# Patient Record
Sex: Male | Born: 1945 | Race: Black or African American | Hispanic: No | Marital: Married | Smoking: Current some day smoker
Health system: Southern US, Community
[De-identification: ages and names within clinical notes are randomized; demographics above are authoritative.]

## PROBLEM LIST (undated history)

## (undated) DIAGNOSIS — I10 Essential (primary) hypertension: Secondary | ICD-10-CM

## (undated) DIAGNOSIS — E782 Mixed hyperlipidemia: Secondary | ICD-10-CM

## (undated) DIAGNOSIS — E1169 Type 2 diabetes mellitus with other specified complication: Secondary | ICD-10-CM

## (undated) DIAGNOSIS — Z923 Personal history of irradiation: Secondary | ICD-10-CM

## (undated) DIAGNOSIS — C320 Malignant neoplasm of glottis: Secondary | ICD-10-CM

## (undated) HISTORY — DX: Type 2 diabetes mellitus with other specified complication: E11.69

## (undated) HISTORY — DX: Mixed hyperlipidemia: E78.2

## (undated) HISTORY — PX: OTHER SURGICAL HISTORY: SHX169

---

## 2014-01-04 DIAGNOSIS — C320 Malignant neoplasm of glottis: Secondary | ICD-10-CM

## 2014-01-04 HISTORY — DX: Malignant neoplasm of glottis: C32.0

## 2014-01-22 ENCOUNTER — Encounter: Payer: Self-pay | Admitting: Radiation Oncology

## 2014-01-22 NOTE — Progress Notes (Signed)
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  Denies any pain,difficulty swallowing, nor referred otalgia. He was assessed by Dr. Melissa Montane who performed a fiberoptic exam which revealed "an irregular area on the left vocal cord that was suspicious for Squamous Cell Carcinoma."   Biopsies of the Vocal Cord (if applicable) revealed:  09/30/00 Vocal cord Biopsy: 1. Anterior Commissure:      Invasive Squamous Cell Carcinoma 2. Vocal Cord Biopsy: Left:     Invasive Squamous Cell Carcinoma   Nutrition Status:  Weight changes: NO  Swallowing status: Sensation of something sticking in his throat upon presentation, but now denies pain or difficutly swallowing  Plans, if any, for PEG tube:   Tobacco/Marijuana/Snuff/ETOH use: Smoking cigars ~ 5 daily for10 years, No drinking, No Illicit Drug use  Past/Anticipated interventions by otolaryngology, if any: Dr. Melissa Montane: Vocal Cord Biopsy  Past/Anticipated interventions by medical oncology, if any: Unknown  Referrals yet, to any of the following?  Social Work?  Dentistry?  Swallowing therapy?   Nutrition? [name] 01/23/14  Med/Onc?   PEG placement?   SAFETY ISSUES:  Prior radiation?NO  Pacemaker/ICD?NO  Possible current pregnancy? N/A  Is the patient on methotrexate? NO  Current Complaints / other details:

## 2014-01-23 ENCOUNTER — Ambulatory Visit
Admission: RE | Admit: 2014-01-23 | Discharge: 2014-01-23 | Disposition: A | Discharge status: Home / Self Care | Payer: Commercial Managed Care - PPO | Source: Ambulatory Visit | Attending: Radiation Oncology | Admitting: Radiation Oncology

## 2014-01-23 ENCOUNTER — Encounter: Payer: Self-pay | Admitting: Radiation Oncology

## 2014-01-23 ENCOUNTER — Encounter: Payer: Self-pay | Admitting: *Deleted

## 2014-01-23 ENCOUNTER — Ambulatory Visit: Payer: Medicare Other | Admitting: Nutrition

## 2014-01-23 VITALS — BP 142/75 | HR 81 | Temp 99.0°F | Ht 68.0 in | Wt 236.3 lb

## 2014-01-23 DIAGNOSIS — Z510 Encounter for antineoplastic radiation therapy: Secondary | ICD-10-CM | POA: Insufficient documentation

## 2014-01-23 DIAGNOSIS — C320 Malignant neoplasm of glottis: Secondary | ICD-10-CM | POA: Diagnosis not present

## 2014-01-23 DIAGNOSIS — R5381 Other malaise: Secondary | ICD-10-CM

## 2014-01-23 DIAGNOSIS — F17200 Nicotine dependence, unspecified, uncomplicated: Secondary | ICD-10-CM | POA: Diagnosis not present

## 2014-01-23 DIAGNOSIS — R5383 Other fatigue: Secondary | ICD-10-CM

## 2014-01-23 HISTORY — DX: Malignant neoplasm of glottis: C32.0

## 2014-01-23 MED ORDER — LARYNGOSCOPY SOLUTION RAD-ONC
15.0000 mL | Freq: Once | TOPICAL | Status: AC
  Administered 2014-01-23: 15 mL via TOPICAL

## 2014-01-23 NOTE — Progress Notes (Signed)
Radiation Oncology         (336) [phone] ________________________________  Initial inpatient Consultation  Name: Shaun Henry MRN: [id number]  Date: 01/23/2014  DOB: 05/09/1946  CC:No primary provider on file.  Melissa Montane, MD   REFERRING PHYSICIAN: Melissa Montane, MD  DIAGNOSIS: Glottic squamous cell carcinoma, stage I T1N0M0 (to be clarified by discussion with ENT and imaging)  HISTORY OF PRESENT ILLNESS::Shaun Henry is a 68 y.o. male who presented 3 months ago with hoarseness.  Denies any pain,difficulty swallowing, nor referred otalgia. He was assessed by Dr. Melissa Montane who performed a fiberoptic exam which revealed an irregular area on the left vocal cord that was suspicious for Squamous Cell Carcinoma.  Laryngoscopic exam under anesthesia and biopsies were then done on 6-26.  The exophytic mass involved the anterior  Left vocal cord, and extended into the anterior commissure.  The left ventricle above appeared full. No comment about impaired mobility of the cord.  Right cord appeared normal   Biopsies of the Vocal Cord (if applicable) revealed:  3/47/42  Vocal cord Biopsy:  1. Anterior Commissure:  Invasive Squamous Cell Carcinoma  2. Vocal Cord Biopsy: Left:  Invasive Squamous Cell Carcinoma  He denies any current swallowing difficulty or any pain.  No weight loss. He smokes 5 or less cigars daily, and has done so for 10 years. Denies any other tobacco history or ETOH abuse.  PREVIOUS RADIATION THERAPY: No  PAST MEDICAL HISTORY:  has a past medical history of Squamous cell carcinoma of vocal cord (01/04/14).    PAST SURGICAL HISTORY:No past surgical history on file.  FAMILY HISTORY: family history is not on file.  SOCIAL HISTORY:  reports that he has been smoking Cigars.  He does not have any smokeless tobacco history on file. He reports that he does not drink alcohol or use illicit drugs.  ALLERGIES: No known allergies  MEDICATIONS:  Current Outpatient Prescriptions    Medication Sig Dispense Refill  . simvastatin (ZOCOR) 40 MG tablet Take by mouth.      . triamterene-hydrochlorothiazide (DYAZIDE) 37.5-25 MG per capsule Take by mouth.      . nicotine (NICODERM CQ) 14 mg/24hr patch Apply 14 mg patch daily x6wk, then 7 mg patch daily x2wk  14 patch  2  . nicotine (NICODERM CQ) 7 mg/24hr patch Apply 14 mg patch daily x6wk, then 7 mg patch daily x2wk  14 patch  0   No current facility-administered medications for this encounter.    REVIEW OF SYSTEMS:  Notable for that above.   PHYSICAL EXAM:  height is 5\' 8"  (1.727 m) and weight is 236 lb 4.8 oz (107.185 kg). His temperature is 99 F (37.2 C). His blood pressure is 142/75 and his pulse is 81.   General: Alert and oriented, in no acute distress. hoarse HEENT: Head is normocephalic. Pupils are equally round and reactive to light. Extraocular movements are intact. Oropharynx is clear. Poor dentition Neck: There is a 1.5 - 2 cm palpable soft mass in level 3 of the right neck anteriorly. No other signs of cervical or supraclavicular lymphadenopathy. Heart: Regular in rate and rhythm with no murmurs, rubs, or gallops. Chest: Clear to auscultation bilaterally, with no rhonchi, wheezes, or rales. Abdomen: Soft, nontender, nondistended, with no rigidity or guarding. Extremities: No cyanosis or edema in UE's Skin: No concerning lesions. Musculoskeletal: symmetric strength and muscle tone throughout. Neurologic: Cranial nerves II through XII are grossly intact. No obvious focalities. Speech is fluent. Coordination is intact. Psychiatric: Judgment  and insight are intact. Affect is appropriate.  After anesthetizing the nasal cavity with topical lidocaine and oxymetazoline, the flexible endoscope was introduced and passed through the nasal cavity. Unfortunately, the light source for the scop was dimmed and the quality of the images were too dark to ascertain much information regarding his laryngeal/pharyngeal anatomy. I  will NOT charge for this procedure.  ECOG = 0  0 - Asymptomatic (Fully active, able to carry on all predisease activities without restriction)  1 - Symptomatic but completely ambulatory (Restricted in physically strenuous activity but ambulatory and able to carry out work of a light or sedentary nature. For example, light housework, office work)  2 - Symptomatic, <50% in bed during the day (Ambulatory and capable of all self care but unable to carry out any work activities. Up and about more than 50% of waking hours)  3 - Symptomatic, >50% in bed, but not bedbound (Capable of only limited self-care, confined to bed or chair 50% or more of waking hours)  4 - Bedbound (Completely disabled. Cannot carry on any self-care. Totally confined to bed or chair)  5 - Death   Eustace Pen MM, Creech RH, Tormey DC, et al. [phone]). "Toxicity and response criteria of the [facility]". [place] Oncol. 5 (6): 649-55   LABORATORY DATA:  No results found for this basename: WBC,  HGB,  HCT,  MCV,  PLT   CMP  No results found for this basename: na,  k,  cl,  co2,  glucose,  bun,  creatinine,  calcium,  prot,  albumin,  ast,  alt,  alkphos,  bilitot,  gfrnonaa,  gfraa      PATHOLOGY: 01/04/14  Vocal cord Biopsy:  1. Anterior Commissure:  Invasive Squamous Cell Carcinoma  2. Vocal Cord Biopsy: Left:  Invasive Squamous Cell Carcinoma    RADIOGRAPHY: No results found.    IMPRESSION/PLAN: This patient has what appears to be clinical T1 vs T2 glottic squamous cell carcinoma. I need to speak with Dr. Janace Hoard to clarify this as our laryngoscope was out of order today.  I also want to rule out neck nodes with CT imaging due to a soft mass in the right cervical neck today.  I suspect this will be negative.  Will also obtain a chest CT at that time for staging and ruling out secondary lung cancer.  For what is likely going to be an early stage glottic cancer, I recommend definitive external  beam radiotherapy to a dose of 63 to 65.25 Gray in 28 to 29 fractions at 2.25 gray per fraction. I would treat with opposed lateral beams focussed on the larynx. I will make sure that the dose the anterior commissure it is adequate.  We discussed the risks benefits and side effects of laryngeal radiotherapy which include but are not necessarily limited to fatigue, skin irritation and esophagitis in the acute setting. He understands that late effects may include rare damage to the internal tissues including the cartilage, larynx, and esophagus. He is enthusiastic about proceeding. A consent form has been signed and placed in his chart. We will simulate him next week following the CT staging scans, anticipating starting treatment a week thereafter.  The patient continues to use tobacco. The patient was counseled to stop using tobacco and was offered pharmacotherapy and further counseling to help with this. The patient is interested in pharmacotherapy  - nicotine patches. Rx made today.   He will see Social Work and our Nutritionist Today.  Will screen with a TSH next week to rule out baseline hypothyroidism. He lives in [place] and I offered to refer him to [facility] for treatments but he prefers to drive 30 min to [place] for treatments here. __________________________________________   Eppie Gibson, MD

## 2014-01-23 NOTE — Progress Notes (Signed)
68 year old male diagnosed with throat soreness and hoarseness/Glottic cancer.  He is a patient of Dr. Isidore Moos.  Past medical history includes tobacco.  Medications include Zocor.  Labs were reviewed.  Height:68 inches. Weight: 236.3 pounds. UBW: 240 pounds. BMI: 35.94  Patient and wife here for first appointment with MD.  Patient denies current nutrition side effects.  States difficulty swallowing has resolved and now "it has just moved down."  No weight changes.  Nutrition Diagnosis:  Predicted suboptimal energy intake related to glottic cancer and associated treatments as evidenced by this condition for which research shows inadequate oral intake.  Intervention:  Educated patient on strategies for eating healthy diet with adequate calories and protein to promote maintenance of lean body mass.  Discussed strategies for eating with difficulty swallowing.  Encouraged small frequent meals and snacks.  Provided fact sheets and contact information.  Teach back method used.  Monitoring, Evaluation, Goals:  Patient will tolerate healthy diet with adequate calories and protein to promote maintenance of lean body mass.  Next Visit:  To be scheduled as needed.

## 2014-01-24 ENCOUNTER — Telehealth: Payer: Self-pay | Admitting: *Deleted

## 2014-01-24 ENCOUNTER — Encounter: Payer: Self-pay | Admitting: Radiation Oncology

## 2014-01-24 MED ORDER — NICOTINE 14 MG/24HR TD PT24: MEDICATED_PATCH | TRANSDERMAL | Status: DC

## 2014-01-24 MED ORDER — NICOTINE 7 MG/24HR TD PT24
MEDICATED_PATCH | TRANSDERMAL | Status: DC
Start: 2014-01-24 — End: 2014-04-19

## 2014-01-24 NOTE — Telephone Encounter (Signed)
CALLED PATIENT TO INFORM OF TESTS, LVM FOR A RETURN CALL 

## 2014-01-25 ENCOUNTER — Telehealth: Payer: Self-pay | Admitting: *Deleted

## 2014-01-25 NOTE — Telephone Encounter (Signed)
CALLED PATIENT TO INFORM OF LAB, TEST, FNC VISIT WITH DR. Isidore Moos AND SIM, SPOKE WITH PATIENT'S WIFE EVA AND SHE IS AWARE OF THESE APPTS.

## 2014-01-26 NOTE — Progress Notes (Signed)
Met with patient and his wife prior to initial consult with Dr. Isidore Moos.  Introduced myself as Designer, television/film set, explained my role as a member of the Care Team, provided contact information, encouraged them to contact me with questions/concerns as treatments/procedures begin.  They indicated understanding.   Briefly explained RT tmt, including showing/explanation of mask.  Initiating navigation as L1 patient (new) with this encounter.  Gayleen Orem, RN, BSN, [facility] Head & Neck Oncology Navigator [phone]

## 2014-01-31 ENCOUNTER — Telehealth: Payer: Self-pay | Admitting: *Deleted

## 2014-01-31 NOTE — Telephone Encounter (Signed)
Called patient at home and mobile #s to see if he had any questions prior to his arrival tomorrow for CT SIM.  LVMM.  Gayleen Orem, RN, BSN, [facility] Head & Neck Oncology Navigator [phone]

## 2014-02-01 ENCOUNTER — Encounter: Payer: Self-pay | Admitting: *Deleted

## 2014-02-01 ENCOUNTER — Encounter: Payer: Self-pay | Admitting: Radiation Oncology

## 2014-02-01 ENCOUNTER — Ambulatory Visit
Admission: RE | Admit: 2014-02-01 | Discharge: 2014-02-01 | Disposition: A | Discharge status: Home / Self Care | Payer: Commercial Managed Care - PPO | Source: Ambulatory Visit | Attending: Radiation Oncology | Admitting: Radiation Oncology

## 2014-02-01 ENCOUNTER — Ambulatory Visit: Payer: Commercial Managed Care - PPO

## 2014-02-01 ENCOUNTER — Ambulatory Visit (HOSPITAL_COMMUNITY)
Admission: RE | Admit: 2014-02-01 | Discharge: 2014-02-01 | Disposition: A | Discharge status: Home / Self Care | Payer: Commercial Managed Care - PPO | Source: Ambulatory Visit | Attending: Radiation Oncology | Admitting: Radiation Oncology

## 2014-02-01 ENCOUNTER — Ambulatory Visit: Admission: RE | Admit: 2014-02-01 | Payer: Self-pay | Source: Ambulatory Visit | Admitting: Radiation Oncology

## 2014-02-01 ENCOUNTER — Encounter (HOSPITAL_COMMUNITY): Payer: Self-pay

## 2014-02-01 VITALS — BP 132/86 | HR 82 | Temp 98.1°F | Resp 12 | Wt 236.1 lb

## 2014-02-01 DIAGNOSIS — M47812 Spondylosis without myelopathy or radiculopathy, cervical region: Secondary | ICD-10-CM | POA: Insufficient documentation

## 2014-02-01 DIAGNOSIS — E049 Nontoxic goiter, unspecified: Secondary | ICD-10-CM | POA: Insufficient documentation

## 2014-02-01 DIAGNOSIS — C320 Malignant neoplasm of glottis: Secondary | ICD-10-CM | POA: Insufficient documentation

## 2014-02-01 DIAGNOSIS — R5381 Other malaise: Secondary | ICD-10-CM

## 2014-02-01 DIAGNOSIS — Z79899 Other long term (current) drug therapy: Secondary | ICD-10-CM | POA: Insufficient documentation

## 2014-02-01 DIAGNOSIS — K119 Disease of salivary gland, unspecified: Secondary | ICD-10-CM | POA: Insufficient documentation

## 2014-02-01 DIAGNOSIS — R5383 Other fatigue: Secondary | ICD-10-CM

## 2014-02-01 DIAGNOSIS — Z510 Encounter for antineoplastic radiation therapy: Secondary | ICD-10-CM | POA: Diagnosis not present

## 2014-02-01 HISTORY — DX: Essential (primary) hypertension: I10

## 2014-02-01 LAB — CBC WITH DIFFERENTIAL/PLATELET
BASO%: 0.8 % (ref 0.0–2.0)
Basophils Absolute: 0 10*3/uL (ref 0.0–0.1)
EOS%: 1.9 % (ref 0.0–7.0)
Eosinophils Absolute: 0.1 10*3/uL (ref 0.0–0.5)
HCT: 43.8 % (ref 38.4–49.9)
HGB: 14.2 g/dL (ref 13.0–17.1)
LYMPH%: 39.3 % (ref 14.0–49.0)
MCH: 27 pg — ABNORMAL LOW (ref 27.2–33.4)
MCHC: 32.4 g/dL (ref 32.0–36.0)
MCV: 83.4 fL (ref 79.3–98.0)
MONO#: 0.5 10*3/uL (ref 0.1–0.9)
MONO%: 10.2 % (ref 0.0–14.0)
NEUT%: 47.8 % (ref 39.0–75.0)
NEUTROS ABS: 2.4 10*3/uL (ref 1.5–6.5)
Platelets: 198 10*3/uL (ref 140–400)
RBC: 5.26 10*6/uL (ref 4.20–5.82)
RDW: 13.3 % (ref 11.0–14.6)
WBC: 4.9 10*3/uL (ref 4.0–10.3)
lymph#: 1.9 10*3/uL (ref 0.9–3.3)

## 2014-02-01 LAB — BASIC METABOLIC PANEL (CC13)
ANION GAP: 9 meq/L (ref 3–11)
BUN: 16.6 mg/dL (ref 7.0–26.0)
CALCIUM: 10 mg/dL (ref 8.4–10.4)
CO2: 27 mEq/L (ref 22–29)
Chloride: 104 mEq/L (ref 98–109)
Creatinine: 1.2 mg/dL (ref 0.7–1.3)
Glucose: 95 mg/dl (ref 70–140)
Potassium: 4.1 mEq/L (ref 3.5–5.1)
SODIUM: 140 meq/L (ref 136–145)

## 2014-02-01 LAB — TSH CHCC: TSH: 0.374 m[IU]/L (ref 0.320–4.118)

## 2014-02-01 MED ORDER — IOHEXOL 300 MG/ML  SOLN
100.0000 mL | INTRAMUSCULAR | Status: DC | PRN
  Administered 2014-02-01: 100 mL via INTRAVENOUS

## 2014-02-01 NOTE — Progress Notes (Signed)
He is currently in no pain.  Pt presenting appropriate quality, quantity and organization of sentences. Pt denies dysphagia. The patient eats a regular, healthy diet.. Oral exam reveals mucous membranes moist, pharynx normal without lesions.

## 2014-02-01 NOTE — Progress Notes (Addendum)
Radiation Oncology         (336) [phone] ________________________________  Name: Shaun Henry MRN: [id number]  Date: 02/01/2014  DOB: June 05, 1946  Follow-Up Visit Note  Outpatient  CC: No PCP Per Patient  Melissa Montane, MD  Diagnosis: T2N0M0 Stage II Glottic carcinoma  Narrative:  The patient returns today for routine follow-up.  Pt denies dysphagia. The patient eats a regular, healthy diet.. Oral exam by nursing reveals mucous membranes moist, pharynx normal without lesions                               ALLERGIES:  is allergic to no known allergies.  Meds: Current Outpatient Prescriptions  Medication Sig Dispense Refill  . nicotine (NICODERM CQ) 14 mg/24hr patch Apply 14 mg patch daily x6wk, then 7 mg patch daily x2wk  14 patch  2  . nicotine (NICODERM CQ) 7 mg/24hr patch Apply 14 mg patch daily x6wk, then 7 mg patch daily x2wk  14 patch  0  . simvastatin (ZOCOR) 40 MG tablet Take by mouth.      . triamterene-hydrochlorothiazide (DYAZIDE) 37.5-25 MG per capsule Take by mouth.       No current facility-administered medications for this encounter.   Facility-Administered Medications Ordered in Other Encounters  Medication Dose Route Frequency Provider Last Rate Last Dose  . iohexol (OMNIPAQUE) 300 MG/ML solution 100 mL  100 mL Intravenous PRN Medication Radiologist, MD   100 mL at 02/01/14 1116    Physical Findings: The patient is in no acute distress. Patient is alert and oriented.  weight is 236 lb 1.6 oz (107.094 kg). His oral temperature is 98.1 F (36.7 C). His blood pressure is 132/86 and his pulse is 82. His respiration is 12 and oxygen saturation is 98%. .     Lab Findings: Lab Results  Component Value Date   WBC 4.9 02/01/2014   HGB 14.2 02/01/2014   HCT 43.8 02/01/2014   MCV 83.4 02/01/2014   PLT 198 02/01/2014    Radiographic Findings: Ct Soft Tissue Neck W Contrast  02/01/2014   CLINICAL DATA:  Squamous cell carcinoma left vocal cord.  Staging  EXAM: CT NECK WITH  CONTRAST  TECHNIQUE: Multidetector CT imaging of the neck was performed using the standard protocol following the bolus administration of intravenous contrast.  CONTRAST:  175mL OMNIPAQUE IOHEXOL 300 MG/ML  SOLN  COMPARISON:  None.  FINDINGS: Increased density and soft tissue thickening of the left vocal cord. On coronal images, there is suggestion of extension into the laryngeal ventricle and false cord. Tumor appears to extend into the fat lateral to the cord but there is no invasion of the cartilage. At endoscopy there was involvement of the anterior commissure, difficult to confirm on CT. Right cord is normal.  Enlarged bilateral parotid lymph masses are present. Left parotid masses measure 15 x 9 mm in the mid parotid, 12 x 17 mm in the parotid tail. Right parotid mass centrally measures 10 mm. Inferior right parotid masses measure 15 x 10 mm, and 15 x 9 mm. These are solid enhancing nodes. This would be unusual for metastatic laryngeal cancer. Lymphoma and multiple Warthin's tumor also considered. Tissue sampling recommended.  No additional adenopathy in the neck is identified.  Thyroid goiter. Right thyroid is significantly enlarged measuring 6.7 x 4.1 cm on axial images extending into the superior mediastinum on the right. The isthmus is enlarged. Left lobe of the thyroid is enlarged measuring  3.8 x 3.8 cm.  The tongue and pharynx are normal. Parotid space and submandibular glands are normal.  Cervical spondylosis.  No acute bony change.  IMPRESSION: Carcinoma  left vocal cord with possible supraglottic extension.  Multiple enlarged lymph masses in the parotid bilaterally. Metastatic disease would be unlikely. Lymphoma and multiple benign parotid lesion such Warthin's tumor should be considered. Tissue sampling recommended.  Thyroid goiter, right greater than left.   Electronically Signed   By: Franchot Gallo M.D.   On: 02/01/2014 14:18   Ct Chest W Contrast  02/01/2014   CLINICAL DATA:  Glottic carcinoma   EXAM: CT CHEST WITH CONTRAST  TECHNIQUE: Multidetector CT imaging of the chest was performed during intravenous contrast administration.  CONTRAST:  155mL OMNIPAQUE IOHEXOL 300 MG/ML  SOLN  COMPARISON:  Nuclear medicine thyroid scan 02/15/2003  FINDINGS: There is no axillary or supraclavicular lymphadenopathy. There is enlargement of the of the right lobe of thyroid gland. This enlargement is increased minimally nuclear medicine scan 2004 with a substernal extension along the right side of the trachea.  There is no mediastinal lymphadenopathy. No pericardial fluid. Coronary calcifications are noted. Esophagus is normal.  Review of the lung parenchyma demonstrates no suspicious pulmonary nodules. Limited view of the upper abdomen is unremarkable. Limited view of the skeleton demonstrates degenerative spurring of the spine.  IMPRESSION: 1. No evidence of metastatic adenopathy in the thorax. 2. No pulmonary nodules. 3. Goiterous enlargement of the thyroid gland with substernal extension on the right. Please see accompanying CT neck dictation.   Electronically Signed   By: Suzy Bouchard M.D.   On: 02/01/2014 13:56    Impression/Plan:  Glottic cancer, per imaging, appears to be T2N0M0.  The patient will likley receive 65.25 Gy in 29 fractions to the larynx with opposed lateral beams. Simulate for this today.  That being said, we discussed the lesions in his parotids. These are unusual but hopefully benign. I discussed them with Dr Carlis Abbott - they are amenable to US guided biopsy. I will order this. Patient amenable to this. Treatment will be held until parotid biopsy is done and pathology is back.  Will add to ENT tumor board.  _____________________________________   Eppie Gibson, MD

## 2014-02-01 NOTE — Progress Notes (Signed)
  Radiation Oncology         [phone]) [phone] ________________________________  Name: Shaun Henry MRN: [id number]  Date: 02/01/2014  DOB: 1945/12/02  SIMULATION AND TREATMENT PLANNING NOTE  outpatient  DIAGNOSIS:  Glottic cancer.   ICD-9-CM  1. Glottis carcinoma 161.0     NARRATIVE:  The patient was brought to the [facility].  Identity was confirmed.  All relevant records and images related to the planned course of therapy were reviewed.  The patient freely provided informed written consent to proceed with treatment after reviewing the details related to the planned course of therapy. The consent form was witnessed and verified by the simulation staff.    Then, the patient was set-up in a stable reproducible  supine position for radiation therapy with aquaplast mask.  CT images were obtained.  Surface markings were placed.  The CT images were loaded into the planning software.    TREATMENT PLANNING NOTE: Treatment planning then occurred.  The radiation prescription was entered and confirmed.    A total of 1 medically necessary complex treatment devices were fabricated and supervised by me - aquaplast mask.  IF WEDGES ARE USED - 2 more complex devices may be needed. I have requested : 3D Simulation  I have requested a DVH of the following structures: cord, esophagus, CTV.    The patient will receive 65.25 Gy in 29 fractions to the larynx with opposed lateral beams.  Treatment will be held until parotid biopsy is done and pathology is back. -----------------------------------  Eppie Gibson, MD

## 2014-02-01 NOTE — Progress Notes (Signed)
To provide support, encouragement and care continuity, met with pt and his wife during appt with Dr. Isidore Moos and during [facility].  After SIM, toured RT area, showed them [place] [facility] tmt area and dsg rooms, explained arrival procedure when paged from lobby.  They verbalized understanding.  Gayleen Orem, RN, BSN, [facility] Head & Neck Oncology Navigator [phone]

## 2014-02-04 ENCOUNTER — Encounter: Payer: Self-pay | Admitting: Radiation Oncology

## 2014-02-04 NOTE — Progress Notes (Signed)
Rec'd completed FMLA paperwork back from physician.  Made copy for scanning - mailed original to patient.

## 2014-02-05 ENCOUNTER — Telehealth: Payer: Self-pay | Admitting: *Deleted

## 2014-02-05 NOTE — Telephone Encounter (Addendum)
Ms. Funari called today with questions regarding completion of Her spouse's FMLA paper work.  Informed her that the Financial Advocate, Fletcher Anon, sent the paperwork via mail on yesterday.  She expressed thanks.

## 2014-02-08 ENCOUNTER — Ambulatory Visit (HOSPITAL_COMMUNITY)
Admission: RE | Admit: 2014-02-08 | Discharge: 2014-02-08 | Disposition: A | Discharge status: Home / Self Care | Payer: Commercial Managed Care - PPO | Source: Ambulatory Visit | Attending: Radiation Oncology | Admitting: Radiation Oncology

## 2014-02-08 ENCOUNTER — Other Ambulatory Visit: Payer: Self-pay | Admitting: Radiation Oncology

## 2014-02-08 DIAGNOSIS — C320 Malignant neoplasm of glottis: Secondary | ICD-10-CM

## 2014-02-08 DIAGNOSIS — K119 Disease of salivary gland, unspecified: Secondary | ICD-10-CM | POA: Diagnosis present

## 2014-02-08 NOTE — Procedures (Signed)
L neck LN Bx 18 g core times 4 No comp

## 2014-02-08 NOTE — H&P (Signed)
Shaun Henry is an 68 y.o. male.   Chief Complaint: Parotid mass HPI: For BX.   Past Medical History  Diagnosis Date  . Hypertension   . Squamous cell carcinoma of vocal cord 01/04/14    No past surgical history on file.  Finger operation  No family history on file. Social History:  reports that he has been smoking Cigars.  He does not have any smokeless tobacco history on file. He reports that he does not drink alcohol or use illicit drugs.  Allergies:  Allergies  Allergen Reactions  . No Known Allergies      (Not in a hospital admission)  No results found for this or any previous visit (from the past 48 hour(s)). No results found.  ROS  There were no vitals taken for this visit. Physical Exam  Constitutional: He is oriented to person, place, and time. He appears well-developed and well-nourished.  HENT:  Head: Normocephalic and atraumatic.  Cardiovascular: Normal rate and regular rhythm.   Respiratory: Effort normal and breath sounds normal.  Neurological: He is alert and oriented to person, place, and time.  Skin: Skin is warm and dry.     Assessment/Plan For neck Bx  Marelin Tat,ART A 02/08/2014, 10:11 AM

## 2014-02-11 DIAGNOSIS — Z510 Encounter for antineoplastic radiation therapy: Secondary | ICD-10-CM | POA: Diagnosis not present

## 2014-02-11 NOTE — Addendum Note (Signed)
Encounter addended by: Deirdre Evener, RN on: 02/11/2014  7:42 PM<BR>     Documentation filed: Charges VN

## 2014-02-14 ENCOUNTER — Encounter: Payer: Self-pay | Admitting: *Deleted

## 2014-02-14 ENCOUNTER — Ambulatory Visit
Admission: RE | Admit: 2014-02-14 | Discharge: 2014-02-14 | Disposition: A | Discharge status: Home / Self Care | Payer: Commercial Managed Care - PPO | Source: Ambulatory Visit | Attending: Radiation Oncology | Admitting: Radiation Oncology

## 2014-02-14 DIAGNOSIS — Z510 Encounter for antineoplastic radiation therapy: Secondary | ICD-10-CM | POA: Diagnosis not present

## 2014-02-15 ENCOUNTER — Ambulatory Visit
Admission: RE | Admit: 2014-02-15 | Discharge: 2014-02-15 | Disposition: A | Discharge status: Home / Self Care | Payer: Commercial Managed Care - PPO | Source: Ambulatory Visit | Attending: Radiation Oncology | Admitting: Radiation Oncology

## 2014-02-15 DIAGNOSIS — Z510 Encounter for antineoplastic radiation therapy: Secondary | ICD-10-CM | POA: Diagnosis not present

## 2014-02-16 NOTE — Progress Notes (Signed)
Met with patient and his family during [place] [facility] [place], reinforced procedure for tmt arrival/preparation; they verbalized understanding.  Continuing navigation as L1 patient (new patient).  Gayleen Orem, RN, BSN, [facility] Head & Neck Oncology Navigator [phone]

## 2014-02-18 ENCOUNTER — Ambulatory Visit
Admission: RE | Admit: 2014-02-18 | Discharge: 2014-02-18 | Disposition: A | Discharge status: Home / Self Care | Payer: Commercial Managed Care - PPO | Source: Ambulatory Visit | Attending: Radiation Oncology | Admitting: Radiation Oncology

## 2014-02-18 ENCOUNTER — Encounter: Payer: Self-pay | Admitting: Radiation Oncology

## 2014-02-18 VITALS — BP 130/89 | HR 69 | Temp 98.3°F | Ht 68.0 in | Wt 236.3 lb

## 2014-02-18 DIAGNOSIS — C320 Malignant neoplasm of glottis: Secondary | ICD-10-CM

## 2014-02-18 DIAGNOSIS — Z510 Encounter for antineoplastic radiation therapy: Secondary | ICD-10-CM | POA: Diagnosis not present

## 2014-02-18 NOTE — Progress Notes (Signed)
   Weekly Management Note:  outpatient Current Dose:  6.75 Gy  Projected Dose: 65.25 Gy   Narrative:  The patient presents for routine under treatment assessment.  CBCT/MVCT images/Port film x-rays were reviewed.  The chart was checked. NAD, no complaints  Physical Findings:  height is 5\' 8"  (1.727 m) and weight is 236 lb 4.8 oz (107.185 kg). His temperature is 98.3 F (36.8 C). His blood pressure is 130/89 and his pulse is 69.  NAD - skin intact  CBC    Component Value Date/Time   WBC 4.9 02/01/2014 0947   RBC 5.26 02/01/2014 0947   HGB 14.2 02/01/2014 0947   HCT 43.8 02/01/2014 0947   PLT 198 02/01/2014 0947   MCV 83.4 02/01/2014 0947   MCH 27.0* 02/01/2014 0947   MCHC 32.4 02/01/2014 0947   RDW 13.3 02/01/2014 0947   LYMPHSABS 1.9 02/01/2014 0947   MONOABS 0.5 02/01/2014 0947   EOSABS 0.1 02/01/2014 0947   BASOSABS 0.0 02/01/2014 0947     CMP     Component Value Date/Time   NA 140 02/01/2014 0947   K 4.1 02/01/2014 0947   CO2 27 02/01/2014 0947   GLUCOSE 95 02/01/2014 0947   BUN 16.6 02/01/2014 0947   CREATININE 1.2 02/01/2014 0947   CALCIUM 10.0 02/01/2014 0947   Lab Results  Component Value Date   TSH 0.374 02/01/2014     Impression:  The patient is tolerating radiotherapy.   Plan:  Continue radiotherapy as planned.    -----------------------------------  Eppie Gibson, MD

## 2014-02-18 NOTE — Progress Notes (Signed)
Mr. Broner has received 3 fractions to his larynx.  No voiced concerns presently.

## 2014-02-19 ENCOUNTER — Ambulatory Visit
Admission: RE | Admit: 2014-02-19 | Discharge: 2014-02-19 | Disposition: A | Discharge status: Home / Self Care | Payer: Commercial Managed Care - PPO | Source: Ambulatory Visit | Attending: Radiation Oncology | Admitting: Radiation Oncology

## 2014-02-19 ENCOUNTER — Ambulatory Visit: Payer: Commercial Managed Care - PPO | Admitting: Nutrition

## 2014-02-19 DIAGNOSIS — Z510 Encounter for antineoplastic radiation therapy: Secondary | ICD-10-CM | POA: Diagnosis not present

## 2014-02-19 NOTE — Progress Notes (Signed)
Brief followup with patient to explain reason for weekly followups.  Patient is being treated for laryngeal cancer and has had 3 radiation treatments.   Weight is stable at 236.1 pounds documented July 24.   Patient denies nutrition issues.  Nutrition diagnosis: Predicted suboptimal energy intake continues.  Intervention: Encouraged patient to consume adequate calories to promote weight maintenance. Explained he will have weekly followups with me to touch base and address any nutrition problems.  Monitoring, evaluation, goals: Patient will tolerate a healthy diet with adequate calories and protein to promote maintenance of lean body mass.  Next visit: Tuesday, August 18, before radiation therapy.  **Disclaimer: This note was dictated with voice recognition software. Similar sounding words can inadvertently be transcribed and this note may contain transcription errors which may not have been corrected upon publication of note.**

## 2014-02-20 ENCOUNTER — Ambulatory Visit
Admission: RE | Admit: 2014-02-20 | Discharge: 2014-02-20 | Disposition: A | Discharge status: Home / Self Care | Payer: Commercial Managed Care - PPO | Source: Ambulatory Visit | Attending: Radiation Oncology | Admitting: Radiation Oncology

## 2014-02-20 DIAGNOSIS — Z510 Encounter for antineoplastic radiation therapy: Secondary | ICD-10-CM | POA: Diagnosis not present

## 2014-02-21 ENCOUNTER — Ambulatory Visit
Admission: RE | Admit: 2014-02-21 | Discharge: 2014-02-21 | Disposition: A | Discharge status: Home / Self Care | Payer: Commercial Managed Care - PPO | Source: Ambulatory Visit | Attending: Radiation Oncology | Admitting: Radiation Oncology

## 2014-02-21 DIAGNOSIS — Z510 Encounter for antineoplastic radiation therapy: Secondary | ICD-10-CM | POA: Diagnosis not present

## 2014-02-22 ENCOUNTER — Ambulatory Visit
Admission: RE | Admit: 2014-02-22 | Discharge: 2014-02-22 | Disposition: A | Discharge status: Home / Self Care | Payer: Commercial Managed Care - PPO | Source: Ambulatory Visit | Attending: Radiation Oncology | Admitting: Radiation Oncology

## 2014-02-22 DIAGNOSIS — Z510 Encounter for antineoplastic radiation therapy: Secondary | ICD-10-CM | POA: Diagnosis not present

## 2014-02-25 ENCOUNTER — Ambulatory Visit
Admission: RE | Admit: 2014-02-25 | Discharge: 2014-02-25 | Disposition: A | Discharge status: Home / Self Care | Payer: Commercial Managed Care - PPO | Source: Ambulatory Visit | Attending: Radiation Oncology | Admitting: Radiation Oncology

## 2014-02-25 VITALS — BP 134/87 | HR 73 | Temp 97.9°F | Resp 14 | Wt 238.9 lb

## 2014-02-25 DIAGNOSIS — C320 Malignant neoplasm of glottis: Secondary | ICD-10-CM

## 2014-02-25 DIAGNOSIS — Z510 Encounter for antineoplastic radiation therapy: Secondary | ICD-10-CM | POA: Diagnosis not present

## 2014-02-25 NOTE — Progress Notes (Signed)
Weekly Management Note:  Site: Larynx Current Dose:  1800  cGy Projected Dose: 6525  cGy  Narrative: The patient is seen today for routine under treatment assessment. CBCT/MVCT images/port films were reviewed. The chart was reviewed.   He is without complaints today. His hoarseness is unchanged. No dysphagia.  Physical Examination:  Filed Vitals:   02/25/14 1641  BP: 134/87  Pulse: 73  Temp: 97.9 F (36.6 C)  Resp: 14  .  Weight: 238 lb 14.4 oz (108.364 kg). Oral cavity and oropharynx are unremarkable to inspection. Indirect mirror examination not performed today.  Impression: Tolerating radiation therapy well.  Plan: Continue radiation therapy as planned.

## 2014-02-25 NOTE — Progress Notes (Signed)
He is currently in no pain.  Pt presenting appropriate quality, quantity and organization of sentences, pressured. Pt denies dysphagia. The patient eats a regular, healthy diet.. Oral exam reveals mucous membranes moist, pharynx normal without lesions. Skin hyperpigmentation -  neck.

## 2014-02-26 ENCOUNTER — Encounter: Payer: Commercial Managed Care - PPO | Admitting: Nutrition

## 2014-02-26 ENCOUNTER — Ambulatory Visit: Admission: RE | Admit: 2014-02-26 | Payer: Commercial Managed Care - PPO | Source: Ambulatory Visit

## 2014-02-27 ENCOUNTER — Ambulatory Visit
Admission: RE | Admit: 2014-02-27 | Discharge: 2014-02-27 | Disposition: A | Discharge status: Home / Self Care | Payer: Commercial Managed Care - PPO | Source: Ambulatory Visit | Attending: Radiation Oncology | Admitting: Radiation Oncology

## 2014-02-27 DIAGNOSIS — Z510 Encounter for antineoplastic radiation therapy: Secondary | ICD-10-CM | POA: Diagnosis not present

## 2014-02-28 ENCOUNTER — Ambulatory Visit
Admission: RE | Admit: 2014-02-28 | Discharge: 2014-02-28 | Disposition: A | Discharge status: Home / Self Care | Payer: Commercial Managed Care - PPO | Source: Ambulatory Visit | Attending: Radiation Oncology | Admitting: Radiation Oncology

## 2014-02-28 ENCOUNTER — Encounter: Payer: Commercial Managed Care - PPO | Admitting: Nutrition

## 2014-02-28 DIAGNOSIS — Z510 Encounter for antineoplastic radiation therapy: Secondary | ICD-10-CM | POA: Diagnosis not present

## 2014-03-01 ENCOUNTER — Ambulatory Visit
Admission: RE | Admit: 2014-03-01 | Discharge: 2014-03-01 | Disposition: A | Discharge status: Home / Self Care | Payer: Commercial Managed Care - PPO | Source: Ambulatory Visit | Attending: Radiation Oncology | Admitting: Radiation Oncology

## 2014-03-01 ENCOUNTER — Encounter: Payer: Self-pay | Admitting: Nutrition

## 2014-03-01 DIAGNOSIS — Z510 Encounter for antineoplastic radiation therapy: Secondary | ICD-10-CM | POA: Diagnosis not present

## 2014-03-01 NOTE — Progress Notes (Signed)
Patient did not show up for nutrition follow-up. 

## 2014-03-04 ENCOUNTER — Ambulatory Visit
Admission: RE | Admit: 2014-03-04 | Discharge: 2014-03-04 | Disposition: A | Discharge status: Home / Self Care | Payer: Commercial Managed Care - PPO | Source: Ambulatory Visit | Attending: Radiation Oncology | Admitting: Radiation Oncology

## 2014-03-04 ENCOUNTER — Encounter: Payer: Self-pay | Admitting: *Deleted

## 2014-03-04 ENCOUNTER — Encounter: Payer: Self-pay | Admitting: Radiation Oncology

## 2014-03-04 VITALS — BP 134/87 | HR 70 | Temp 98.3°F

## 2014-03-04 DIAGNOSIS — C320 Malignant neoplasm of glottis: Secondary | ICD-10-CM

## 2014-03-04 DIAGNOSIS — Z510 Encounter for antineoplastic radiation therapy: Secondary | ICD-10-CM | POA: Diagnosis not present

## 2014-03-04 MED ORDER — LIDOCAINE VISCOUS 2 % MT SOLN: OROMUCOSAL | Status: DC

## 2014-03-04 MED ORDER — BIAFINE EX EMUL
Freq: Two times a day (BID) | CUTANEOUS | Status: DC
  Administered 2014-03-04: 18:00:00 via TOPICAL

## 2014-03-04 MED ORDER — SUCRALFATE 1 G PO TABS: ORAL_TABLET | ORAL | Status: DC

## 2014-03-04 NOTE — Progress Notes (Signed)
   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  CBCT/MVCT images/Port film x-rays were reviewed.  The chart was checked. Dry throat.  Physical Findings:  temperature is 98.3 F (36.8 C). His blood pressure is 134/87 and his pulse is 70.  Skin is hyperpigmented over neck. Oropharyngeal mucosa is intact with no thrush or lesions.    Impression:  The patient is tolerating radiotherapy.  Plan:  Continue radiotherapy as planned. Humidifier considered for dry throat. Rx for Sucralfate, lidocaine MMW given.  ________________________________   Eppie Gibson, M.D.

## 2014-03-04 NOTE — Addendum Note (Signed)
Encounter addended by: Deirdre Evener, RN on: 03/04/2014  6:39 PM<BR>     Documentation filed: Inpatient MAR

## 2014-03-05 ENCOUNTER — Encounter: Payer: Commercial Managed Care - PPO | Admitting: Nutrition

## 2014-03-05 ENCOUNTER — Ambulatory Visit
Admission: RE | Admit: 2014-03-05 | Discharge: 2014-03-05 | Disposition: A | Discharge status: Home / Self Care | Payer: Commercial Managed Care - PPO | Source: Ambulatory Visit | Attending: Radiation Oncology | Admitting: Radiation Oncology

## 2014-03-05 DIAGNOSIS — Z510 Encounter for antineoplastic radiation therapy: Secondary | ICD-10-CM | POA: Diagnosis not present

## 2014-03-06 ENCOUNTER — Ambulatory Visit
Admission: RE | Admit: 2014-03-06 | Discharge: 2014-03-06 | Disposition: A | Discharge status: Home / Self Care | Payer: Commercial Managed Care - PPO | Source: Ambulatory Visit | Attending: Radiation Oncology | Admitting: Radiation Oncology

## 2014-03-06 DIAGNOSIS — Z510 Encounter for antineoplastic radiation therapy: Secondary | ICD-10-CM | POA: Diagnosis not present

## 2014-03-07 ENCOUNTER — Encounter: Payer: Commercial Managed Care - PPO | Admitting: Nutrition

## 2014-03-07 ENCOUNTER — Ambulatory Visit
Admission: RE | Admit: 2014-03-07 | Discharge: 2014-03-07 | Disposition: A | Discharge status: Home / Self Care | Payer: Commercial Managed Care - PPO | Source: Ambulatory Visit | Attending: Radiation Oncology | Admitting: Radiation Oncology

## 2014-03-07 DIAGNOSIS — Z510 Encounter for antineoplastic radiation therapy: Secondary | ICD-10-CM | POA: Diagnosis not present

## 2014-03-07 NOTE — Progress Notes (Signed)
To provide support, encouragement and care continuity, met with patient and his wife during Weekly UT appt with Dr. Isidore Moos. He has completed 12/29 tmts today.  He did not express any needs at this time, I encouraged him to contact me if that changes before I see him next, he expressed understanding.  Initiating navigation as L2 patient (treatments established) with this encounter.  Gayleen Orem, RN, BSN, [place] at [place] [phone]

## 2014-03-08 ENCOUNTER — Ambulatory Visit
Admission: RE | Admit: 2014-03-08 | Discharge: 2014-03-08 | Disposition: A | Discharge status: Home / Self Care | Payer: Commercial Managed Care - PPO | Source: Ambulatory Visit | Attending: Radiation Oncology | Admitting: Radiation Oncology

## 2014-03-08 DIAGNOSIS — Z510 Encounter for antineoplastic radiation therapy: Secondary | ICD-10-CM | POA: Diagnosis not present

## 2014-03-11 ENCOUNTER — Ambulatory Visit
Admission: RE | Admit: 2014-03-11 | Discharge: 2014-03-11 | Disposition: A | Discharge status: Home / Self Care | Payer: Commercial Managed Care - PPO | Source: Ambulatory Visit | Attending: Radiation Oncology | Admitting: Radiation Oncology

## 2014-03-11 ENCOUNTER — Encounter: Payer: Self-pay | Admitting: Radiation Oncology

## 2014-03-11 VITALS — BP 127/79 | HR 73 | Temp 98.3°F | Ht 68.0 in | Wt 235.0 lb

## 2014-03-11 DIAGNOSIS — C320 Malignant neoplasm of glottis: Secondary | ICD-10-CM

## 2014-03-11 DIAGNOSIS — Z510 Encounter for antineoplastic radiation therapy: Secondary | ICD-10-CM | POA: Diagnosis not present

## 2014-03-11 MED ORDER — HYDROCODONE-ACETAMINOPHEN 7.5-325 MG/15ML PO SOLN: 10.0000 mL | Freq: Four times a day (QID) | ORAL | Status: DC | PRN

## 2014-03-11 NOTE — Progress Notes (Signed)
Mr. Tanney has received 17 fractions to the larynx.  Note hoarseness today and he reports that he has pain upon swallowing, graded as a level 7-8 on a scale of 10.  His oral mucosa is without any irritation and slight redness noted in the throat region.

## 2014-03-11 NOTE — Progress Notes (Signed)
   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  CBCT/MVCT images/Port film x-rays were reviewed.  The chart was checked. Throat more sore.    Physical Findings:  height is 5\' 8"  (1.727 m) and weight is 235 lb (106.595 kg). His temperature is 98.3 F (36.8 C). His blood pressure is 127/79 and his pulse is 73.  Oropharyngeal mucosa is intact with no thrush or lesions.   Skin dry, hyperpigmented over neck.    Impression:  The patient is tolerating radiotherapy.  Plan:  Continue radiotherapy as planned. Hycet Rx given.  Biafine for neck. Colace for stool softening while on Hycet.  ________________________________   Eppie Gibson, M.D.

## 2014-03-12 ENCOUNTER — Encounter: Payer: Commercial Managed Care - PPO | Admitting: Nutrition

## 2014-03-12 ENCOUNTER — Ambulatory Visit
Admission: RE | Admit: 2014-03-12 | Discharge: 2014-03-12 | Disposition: A | Discharge status: Home / Self Care | Payer: Commercial Managed Care - PPO | Source: Ambulatory Visit | Attending: Radiation Oncology | Admitting: Radiation Oncology

## 2014-03-12 DIAGNOSIS — Z510 Encounter for antineoplastic radiation therapy: Secondary | ICD-10-CM | POA: Diagnosis not present

## 2014-03-13 ENCOUNTER — Ambulatory Visit
Admission: RE | Admit: 2014-03-13 | Discharge: 2014-03-13 | Disposition: A | Discharge status: Home / Self Care | Payer: Commercial Managed Care - PPO | Source: Ambulatory Visit | Attending: Radiation Oncology | Admitting: Radiation Oncology

## 2014-03-13 DIAGNOSIS — Z510 Encounter for antineoplastic radiation therapy: Secondary | ICD-10-CM | POA: Diagnosis not present

## 2014-03-14 ENCOUNTER — Encounter: Payer: Commercial Managed Care - PPO | Admitting: Nutrition

## 2014-03-14 ENCOUNTER — Ambulatory Visit
Admission: RE | Admit: 2014-03-14 | Discharge: 2014-03-14 | Disposition: A | Discharge status: Home / Self Care | Payer: Commercial Managed Care - PPO | Source: Ambulatory Visit | Attending: Radiation Oncology | Admitting: Radiation Oncology

## 2014-03-14 DIAGNOSIS — Z510 Encounter for antineoplastic radiation therapy: Secondary | ICD-10-CM | POA: Diagnosis not present

## 2014-03-15 ENCOUNTER — Ambulatory Visit
Admission: RE | Admit: 2014-03-15 | Discharge: 2014-03-15 | Disposition: A | Discharge status: Home / Self Care | Payer: Commercial Managed Care - PPO | Source: Ambulatory Visit | Attending: Radiation Oncology | Admitting: Radiation Oncology

## 2014-03-15 DIAGNOSIS — Z510 Encounter for antineoplastic radiation therapy: Secondary | ICD-10-CM | POA: Diagnosis not present

## 2014-03-19 ENCOUNTER — Encounter: Payer: Commercial Managed Care - PPO | Admitting: Nutrition

## 2014-03-19 ENCOUNTER — Ambulatory Visit
Admission: RE | Admit: 2014-03-19 | Discharge: 2014-03-19 | Disposition: A | Discharge status: Home / Self Care | Payer: Commercial Managed Care - PPO | Source: Ambulatory Visit | Attending: Radiation Oncology | Admitting: Radiation Oncology

## 2014-03-19 DIAGNOSIS — Z510 Encounter for antineoplastic radiation therapy: Secondary | ICD-10-CM | POA: Diagnosis not present

## 2014-03-20 ENCOUNTER — Ambulatory Visit
Admission: RE | Admit: 2014-03-20 | Discharge: 2014-03-20 | Disposition: A | Discharge status: Home / Self Care | Payer: Commercial Managed Care - PPO | Source: Ambulatory Visit | Attending: Radiation Oncology | Admitting: Radiation Oncology

## 2014-03-20 VITALS — BP 118/76 | HR 73 | Temp 98.2°F | Wt 233.1 lb

## 2014-03-20 DIAGNOSIS — C320 Malignant neoplasm of glottis: Secondary | ICD-10-CM

## 2014-03-20 DIAGNOSIS — Z510 Encounter for antineoplastic radiation therapy: Secondary | ICD-10-CM | POA: Diagnosis not present

## 2014-03-20 NOTE — Progress Notes (Signed)
   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  CBCT/MVCT images/Port film x-rays were reviewed.  The chart was checked. Denies pain.Takes hycet mainly at bedtime.Oral mucosa looks good.Last bowel movement this morning was normal.  Physical Findings:  weight is 233 lb 1.6 oz (105.733 kg). His temperature is 98.2 F (36.8 C). His blood pressure is 118/76 and his pulse is 73. His oxygen saturation is 100%.  Oropharyngeal mucosa is intact with no thrush or lesions. Skin with marked hyperpigmentation with a few very small areas of dry peeling.   Impression:  The patient is tolerating radiotherapy.  Plan:  Continue radiotherapy as planned. Neosporin to peeling areas if they become moist.   ________________________________   Eppie Gibson, M.D.

## 2014-03-20 NOTE — Progress Notes (Signed)
Weekly assessment of radiation to larynx.Completed 23 of 29 treatments.Denies pain.Takes hycet mainly at bedtime.Oral mucosa looks good.Last bowel movement this morning was normal.Skin with marked hyperpigmentation with a few very small areas of peeling.Continue application of biafine.

## 2014-03-21 ENCOUNTER — Ambulatory Visit
Admission: RE | Admit: 2014-03-21 | Discharge: 2014-03-21 | Disposition: A | Discharge status: Home / Self Care | Payer: Commercial Managed Care - PPO | Source: Ambulatory Visit | Attending: Radiation Oncology | Admitting: Radiation Oncology

## 2014-03-21 ENCOUNTER — Encounter: Payer: Commercial Managed Care - PPO | Admitting: Nutrition

## 2014-03-21 DIAGNOSIS — Z510 Encounter for antineoplastic radiation therapy: Secondary | ICD-10-CM | POA: Diagnosis not present

## 2014-03-22 ENCOUNTER — Encounter: Payer: Self-pay | Admitting: *Deleted

## 2014-03-22 ENCOUNTER — Ambulatory Visit
Admission: RE | Admit: 2014-03-22 | Discharge: 2014-03-22 | Disposition: A | Discharge status: Home / Self Care | Payer: Commercial Managed Care - PPO | Source: Ambulatory Visit | Attending: Radiation Oncology | Admitting: Radiation Oncology

## 2014-03-22 DIAGNOSIS — C320 Malignant neoplasm of glottis: Secondary | ICD-10-CM

## 2014-03-22 DIAGNOSIS — Z510 Encounter for antineoplastic radiation therapy: Secondary | ICD-10-CM | POA: Diagnosis not present

## 2014-03-22 MED ORDER — BIAFINE EX EMUL
Freq: Two times a day (BID) | CUTANEOUS | Status: DC
  Administered 2014-03-22: 17:00:00 via TOPICAL

## 2014-03-22 NOTE — Progress Notes (Signed)
Mr. Shaun Henry has received 25 fractions to his larynx.  Today he presents with peeling and  crusting on his anterior neck for which he states was not present two days ago.  He c/o level 6 pain in this area.  He was assessed by Dr. Lisbeth Renshaw and was instructed to apply Biafine lotion in a thick layer often during the weekend.  Given 2 tubes today.  Instructed to call over the weekend if he has increasing pain, weeping/drainage, or general concerns.  He and he spouse stated agreement with these instructions.  Applied a thick layer of Biafine to his neck prior to disposition to home.

## 2014-03-25 ENCOUNTER — Ambulatory Visit
Admission: RE | Admit: 2014-03-25 | Discharge: 2014-03-25 | Disposition: A | Discharge status: Home / Self Care | Payer: Commercial Managed Care - PPO | Source: Ambulatory Visit | Attending: Radiation Oncology | Admitting: Radiation Oncology

## 2014-03-25 ENCOUNTER — Encounter: Payer: Self-pay | Admitting: Radiation Oncology

## 2014-03-25 ENCOUNTER — Encounter: Payer: Commercial Managed Care - PPO | Admitting: Nutrition

## 2014-03-25 ENCOUNTER — Telehealth: Payer: Self-pay | Admitting: *Deleted

## 2014-03-25 VITALS — BP 150/83 | HR 77 | Temp 99.2°F | Ht 68.0 in

## 2014-03-25 DIAGNOSIS — Z510 Encounter for antineoplastic radiation therapy: Secondary | ICD-10-CM | POA: Diagnosis not present

## 2014-03-25 DIAGNOSIS — C320 Malignant neoplasm of glottis: Secondary | ICD-10-CM

## 2014-03-25 MED ORDER — SILVER SULFADIAZINE 1 % EX CREA
TOPICAL_CREAM | Freq: Two times a day (BID) | CUTANEOUS | Status: DC
  Administered 2014-03-25: 14:00:00 via TOPICAL

## 2014-03-25 NOTE — Telephone Encounter (Signed)
Called patient to see if could come at 3:30 pm today per Dr. Isidore Moos request, to see her prior to treatment so that Dr. Isidore Moos can get to her 5:15 pm meeting on time today, lvm for a return call.

## 2014-03-25 NOTE — Progress Notes (Signed)
   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  CBCT/MVCT images/Port film x-rays were reviewed.  The chart was checked. He has quit smoking.  Skin over neck is worse  Physical Findings:  height is 5\' 8"  (1.727 m). His temperature is 99.2 F (37.3 C). His blood pressure is 150/83 and his pulse is 77.  moist/crusting desquamation over anterior neck. Oral mucosa clear.  Impression:  The patient is tolerating radiotherapy.  Plan:  Continue radiotherapy as planned. Silvadene Rx'd. 2 week f/u ordered  ________________________________   Eppie Gibson, M.D.

## 2014-03-25 NOTE — Progress Notes (Signed)
Mr Shaun Henry continues to have crusting on the anterior neck.  Gently, cleansed small amount of crusting in this area with sterile saline and noted one pin sized area of bleeding in his right anterior neck.  Given Silvadene cream as instructed per Dr. Isidore Moos with instructions to apply bid, but to wash "old" silvadene off the area, as gently and as thoroughly as possible, prior to each application.  Informed that Silvadene may burn upon application, but if this does not stop and worsens, to stop and notify Dr. Isidore Moos, or this RN.  Both Mr. Shaun Henry and his spouse stated understanding.  To treatment area at 1535. Mr. Shaun Henry confirmed that he has no allergy to Sulfa.

## 2014-03-26 ENCOUNTER — Ambulatory Visit
Admission: RE | Admit: 2014-03-26 | Discharge: 2014-03-26 | Disposition: A | Discharge status: Home / Self Care | Payer: Commercial Managed Care - PPO | Source: Ambulatory Visit | Attending: Radiation Oncology | Admitting: Radiation Oncology

## 2014-03-26 ENCOUNTER — Encounter: Payer: Self-pay | Admitting: *Deleted

## 2014-03-26 DIAGNOSIS — Z510 Encounter for antineoplastic radiation therapy: Secondary | ICD-10-CM | POA: Diagnosis not present

## 2014-03-27 ENCOUNTER — Ambulatory Visit: Payer: Commercial Managed Care - PPO

## 2014-03-27 DIAGNOSIS — Z510 Encounter for antineoplastic radiation therapy: Secondary | ICD-10-CM | POA: Diagnosis not present

## 2014-03-28 ENCOUNTER — Encounter: Payer: Self-pay | Admitting: *Deleted

## 2014-03-28 ENCOUNTER — Encounter: Payer: Self-pay | Admitting: Radiation Oncology

## 2014-03-28 ENCOUNTER — Ambulatory Visit
Admission: RE | Admit: 2014-03-28 | Discharge: 2014-03-28 | Disposition: A | Discharge status: Home / Self Care | Payer: Commercial Managed Care - PPO | Source: Ambulatory Visit | Attending: Radiation Oncology | Admitting: Radiation Oncology

## 2014-03-28 ENCOUNTER — Encounter: Payer: Commercial Managed Care - PPO | Admitting: Nutrition

## 2014-03-28 DIAGNOSIS — Z510 Encounter for antineoplastic radiation therapy: Secondary | ICD-10-CM | POA: Diagnosis not present

## 2014-03-29 NOTE — Progress Notes (Signed)
Met with patient and his wife prior to scheduled RT.  Recognizing neck desquamation, we discussed skin care including application of Silvidene.  He indicated he was looking forward to his final tmt on Thursday.  Gayleen Orem, RN, BSN, [place] at [place] [phone]

## 2014-03-29 NOTE — Progress Notes (Signed)
Met with pt and his wife during final RT to offer support and to celebrate end of radiation treatment.  Provided wife with Certificate of Recognition. I explained that my role as navigator will continue for several more months and that I will be calling and/or joining them during follow-up visits, they verbalized understanding.    Gayleen Orem, RN, BSN, [place] at [place] [phone]

## 2014-04-04 NOTE — Progress Notes (Signed)
  Radiation Oncology         (336) [phone] ________________________________  Name: Shaun Henry MRN: [id number]  Date: 03/28/2014  DOB: 1946-04-09  End of Treatment Note  Diagnosis:   T2N0M0 Glottic Squamous Cell Carcinoma     Indication for treatment:  curative       Radiation treatment dates:   02/14/2014-03/28/2014  Site/dose:   Glottis / 65.25 Gy in 29 fractions  Beams/energy:   Opposed fields, 3D / 6MV  Narrative: The patient tolerated radiation treatment relatively well.  Silvadene Rx'd for skin desquamation over his neck.    Plan: The patient has completed radiation treatment. The patient will return to radiation oncology clinic for routine followup in one half month. I advised them to call or return sooner if they have any questions or concerns related to their recovery or treatment.  -----------------------------------  Eppie Gibson, MD

## 2014-04-10 ENCOUNTER — Ambulatory Visit: Payer: Commercial Managed Care - PPO | Admitting: Radiation Oncology

## 2014-04-18 ENCOUNTER — Encounter: Payer: Self-pay | Admitting: Radiation Oncology

## 2014-04-19 ENCOUNTER — Ambulatory Visit
Admission: RE | Admit: 2014-04-19 | Discharge: 2014-04-19 | Disposition: A | Discharge status: Home / Self Care | Payer: Commercial Managed Care - PPO | Source: Ambulatory Visit | Attending: Radiation Oncology | Admitting: Radiation Oncology

## 2014-04-19 ENCOUNTER — Telehealth: Payer: Self-pay | Admitting: *Deleted

## 2014-04-19 ENCOUNTER — Encounter: Payer: Self-pay | Admitting: Radiation Oncology

## 2014-04-19 VITALS — BP 122/85 | HR 64 | Temp 98.6°F | Ht 68.0 in | Wt 230.0 lb

## 2014-04-19 DIAGNOSIS — C320 Malignant neoplasm of glottis: Secondary | ICD-10-CM

## 2014-04-19 HISTORY — DX: Personal history of irradiation: Z92.3

## 2014-04-19 MED ORDER — HYDROCODONE-ACETAMINOPHEN 7.5-325 MG/15ML PO SOLN: 10.0000 mL | Freq: Every evening | ORAL | Status: DC | PRN

## 2014-04-19 NOTE — Telephone Encounter (Signed)
Called patient to inform of fu being moved to 4:50 pm due to appt. Being in early am, spoke with patient's wife, Harmon Pier and she is aware of this appt. change

## 2014-04-19 NOTE — Progress Notes (Signed)
Mr. Conran here for reassessment s/p radiation for glottic cancer.  He denies any pain upon swallowing and reports that he eats "anything I want" and denies any dry mouth.  He rpoerts that his energy level is pretty good.  Note hyper/Hypo pigmentation of his anterior neck with a small scab at the base of the right anterior neck region.  Using Silvadene BID.

## 2014-04-19 NOTE — Progress Notes (Signed)
  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  Feels well.  Takes Hydrocodone elixir, 15mL, at night, only.  Throat pain otherwise manageable in the day. Eating most anything. Energy good, skin healing well.              ALLERGIES:  is allergic to no known allergies.  Meds: Current Outpatient Prescriptions  Medication Sig Dispense Refill  . HYDROcodone-acetaminophen (HYCET) 7.5-325 mg/15 ml solution Take 10 mLs by mouth at bedtime as needed for moderate pain.  120 mL  0  . simvastatin (ZOCOR) 40 MG tablet Take by mouth.      . sucralfate (CARAFATE) 1 G tablet Dissolve 1 tablet in 10 mL H20 and swallow up to QID, PRN dry throat.  60 tablet  5  . triamterene-hydrochlorothiazide (DYAZIDE) 37.5-25 MG per capsule Take by mouth.       No current facility-administered medications for this encounter.    Physical Findings: The patient is in no acute distress. Patient is alert and oriented.  height is 5\' 8"  (1.727 m) and weight is 230 lb (104.327 kg). His temperature is 98.6 F (37 C). His blood pressure is 122/85 and his pulse is 64. [name] Oropharyngeal mucosa is intact with no thrush or lesions.  Skin intact, with heterogeneous pigmentation over neck.     Lab Findings: Lab Results  Component Value Date   WBC 4.9 02/01/2014   HGB 14.2 02/01/2014   HCT 43.8 02/01/2014   MCV 83.4 02/01/2014   PLT 198 02/01/2014    Lab Results  Component Value Date   TSH 0.374 02/01/2014    Radiographic Findings: No results  found.  Impression/Plan:    1) Head and Neck Cancer Status: healing from RT  2) Nutritional Status: doing well with PO intake - weight:stable - PEG tube:none  3) Risk Factors: The patient has been educated about risk factors including alcohol and tobacco abuse; they understand that avoidance of alcohol and tobacco is important to prevent recurrences as well as other cancers  4) Swallowing:good  5) Energy: good, check TSH q6-12 mo  6) Social: No active social issues to address at this time  7) Other: Apply Biafine to skin for healing; transition to Vit E lotion in ~1 mo  8) Follow-up in 4 months. I will ask Gayleen Orem, RN, our Head and Neck Oncology Navigator to arrange f/u with Dr Janace Hoard for laryngoscopy in 2 mo. The patient was encouraged to call with any issues or questions before then.  _____________________________________   Eppie Gibson, MD

## 2014-04-22 ENCOUNTER — Telehealth: Payer: Self-pay | Admitting: *Deleted

## 2014-04-22 NOTE — Telephone Encounter (Signed)
Per Dr. Pearlie Oyster 04/19/14 f/u appt notes, spoke with Theadora Rama at [facility], requested 49-month follow-up appt with laryngoscopy for patient with Dr. Janace Hoard.  She indicated they would contact patient to arrange.  Gayleen Orem, RN, BSN, [place] at [place] [phone]

## 2014-05-27 ENCOUNTER — Encounter: Payer: Self-pay | Admitting: Radiation Oncology

## 2014-05-27 NOTE — Progress Notes (Signed)
11.16.15:  Forward Cancer Claim form from [employer] to RN for processing

## 2014-06-04 ENCOUNTER — Encounter: Payer: Self-pay | Admitting: Radiation Oncology

## 2014-06-04 NOTE — Progress Notes (Signed)
[employer] paperwork rec'd back from physician with note that the patient needs to contact medical records to obtain copy of path.  Will call patient to notify paperwork ready to pick up.

## 2014-06-21 ENCOUNTER — Ambulatory Visit
Admission: RE | Admit: 2014-06-21 | Payer: Commercial Managed Care - PPO | Source: Ambulatory Visit | Admitting: Radiation Oncology

## 2014-08-08 ENCOUNTER — Encounter: Payer: Self-pay | Admitting: *Deleted

## 2014-08-09 ENCOUNTER — Telehealth: Payer: Self-pay | Admitting: *Deleted

## 2014-08-09 ENCOUNTER — Other Ambulatory Visit: Payer: Self-pay | Admitting: Radiation Oncology

## 2014-08-09 DIAGNOSIS — R634 Abnormal weight loss: Secondary | ICD-10-CM

## 2014-08-09 DIAGNOSIS — C320 Malignant neoplasm of glottis: Secondary | ICD-10-CM

## 2014-08-09 NOTE — Telephone Encounter (Signed)
CALLED PATIENT TO ASK TO BE HERE @ 3:45 PM ON 08-16-14, LVM FOR A RETURN CALL

## 2014-08-16 ENCOUNTER — Ambulatory Visit
Admission: RE | Admit: 2014-08-16 | Discharge: 2014-08-16 | Disposition: A | Discharge status: Home / Self Care | Payer: Commercial Managed Care - PPO | Source: Ambulatory Visit | Attending: Radiation Oncology | Admitting: Radiation Oncology

## 2014-08-16 VITALS — BP 130/82 | HR 76 | Temp 98.3°F | Ht 68.0 in | Wt 241.6 lb

## 2014-08-16 DIAGNOSIS — C320 Malignant neoplasm of glottis: Secondary | ICD-10-CM

## 2014-08-16 DIAGNOSIS — B379 Candidiasis, unspecified: Secondary | ICD-10-CM | POA: Diagnosis not present

## 2014-08-16 DIAGNOSIS — Z8521 Personal history of malignant neoplasm of larynx: Secondary | ICD-10-CM | POA: Insufficient documentation

## 2014-08-16 DIAGNOSIS — R634 Abnormal weight loss: Secondary | ICD-10-CM

## 2014-08-16 DIAGNOSIS — Z08 Encounter for follow-up examination after completed treatment for malignant neoplasm: Secondary | ICD-10-CM | POA: Diagnosis present

## 2014-08-16 MED ORDER — LARYNGOSCOPY SOLUTION RAD-ONC
15.0000 mL | Freq: Once | TOPICAL | Status: AC
  Administered 2014-08-16: 15 mL via TOPICAL

## 2014-08-16 MED ORDER — FLUCONAZOLE 100 MG PO TABS: ORAL_TABLET | ORAL | Status: DC

## 2014-08-16 NOTE — Progress Notes (Signed)
Shaun Henry returns for Fu s/p xrt to his glottic region.  He denies any pain nor difficulty swallowing and eats 'anything" he wants.  He has gained 11 lbs since 04/19/14.  Note hypopigmentation of the anterior neck, but skin remains intact.

## 2014-08-16 NOTE — Progress Notes (Signed)
  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    He denies any pain nor difficulty swallowing and eats 'anything" he wants.  He has gained 11 lbs since 04/19/14.  Voice less hoarse.           ALLERGIES:  is allergic to no known allergies.  Meds: Current Outpatient Prescriptions  Medication Sig Dispense Refill  . simvastatin (ZOCOR) 40 MG tablet Take by mouth.    . triamterene-hydrochlorothiazide (DYAZIDE) 37.5-25 MG per capsule Take by mouth.     No current facility-administered medications for this encounter.    Physical Findings: The patient is in no acute distress. Patient is alert and oriented.  height is 5\' 8"  (1.727 m) and weight is 241 lb 9.6 oz (109.589 kg). His temperature is 98.3 F (36.8 C). His blood pressure is 130/82 and his pulse is 76. [name] Oropharyngeal mucosa is intact with no thrush or lesions.  Skin intact, with heterogeneous pigmentation over neck.   Heart RRR, Lungs CTAB.  PROCEDURE NOTE: After anesthetizing the nasal cavity with topical lidocaine and phenylephrine, the flexible endoscope was introduced and passed through the nasal cavity. Thrush in pharynx.  No pharyngeal or laryngeal lesions appreciated. True cords are symmetrically mobile   Lab Findings: Lab Results  Component Value Date   WBC 4.9 02/01/2014   HGB 14.2 02/01/2014   HCT 43.8 02/01/2014   MCV 83.4 02/01/2014   PLT 198 02/01/2014    Lab Results  Component Value Date   TSH 0.374 02/01/2014     Radiographic Findings: No results found.  Impression/Plan:    1) Head and Neck Cancer Status: NED  2) Nutritional Status: doing well with PO intake  3) Risk Factors: The patient has been educated about risk factors including alcohol and tobacco abuse; they understand that avoidance of alcohol and tobacco is important to prevent recurrences as well as other cancers.    4) Swallowing:good  5) Energy: good, check TSH q6-12 mo. TSH pending today.  6) Social: No active social issues to address at this time  7)   Follow-up in 5 months. F/u with Dr Janace Hoard in 2 mo.  8)  Thrush in pharynx: Diflucan x 1 week  The patient was encouraged to call with any issues or questions before then.  _____________________________________   Eppie Gibson, MD

## 2014-08-19 LAB — TSH CHCC: TSH: 0.426 m(IU)/L (ref 0.320–4.118)

## 2015-01-22 ENCOUNTER — Ambulatory Visit: Payer: Commercial Managed Care - PPO | Admitting: Radiation Oncology

## 2015-01-23 NOTE — Progress Notes (Signed)
Pain Status: no  Nutritional Status 1) Using a feeding tube?: No 2) Summary of daily intake: Eating "anything he wants" 3) Weight changes, if any: has gained 8 lbs since February.  Swallowing Status: Denies pain or difficulty upon swallowing  Smoking or chewing tobacco: smokes occasional cigar  Dental (if applicable):  Next dental appointment is on: says he is going to schedule an appointment.  Using fluoride trays daily? no   Summary of last ENT visit is: 06/17/14 "excellent appearing glottis with no exophytic lesion remaining.  Thickening of the arytenoids and vocal cords.  Normal voice.  Does not have any future visits scheduled as reported by scheduler  Summary of last medical oncology visit (if applicable) is: N/A.   Next med/onc visit is on N/A.  Imaging done in the last month (if applicable) revealed: None  Other notable issues, if any: Last TSH from 08/16/14 - 0.426.  Denies having a dry mouth, thick saliva.  Denies difficulty swallowing.  He denies fatigue.    BP 143/88 mmHg  Pulse 82  Temp(Src) 98.5 F (36.9 C) (Oral)  Resp 16  Ht 5\' 8"  (1.727 m)  Wt 249 lb 6.4 oz (113.127 kg)  BMI 37.93 kg/m2  SpO2 97%   Wt Readings from Last 3 Encounters:  01/24/15 249 lb 6.4 oz (113.127 kg)  08/16/14 241 lb 9.6 oz (109.589 kg)  04/19/14 230 lb (104.327 kg)

## 2015-01-24 ENCOUNTER — Ambulatory Visit
Admission: RE | Admit: 2015-01-24 | Discharge: 2015-01-24 | Disposition: A | Discharge status: Home / Self Care | Payer: Commercial Managed Care - PPO | Source: Ambulatory Visit | Attending: Radiation Oncology | Admitting: Radiation Oncology

## 2015-01-24 ENCOUNTER — Encounter: Payer: Self-pay | Admitting: Radiation Oncology

## 2015-01-24 VITALS — BP 143/88 | HR 82 | Temp 98.5°F | Resp 16 | Ht 68.0 in | Wt 249.4 lb

## 2015-01-24 DIAGNOSIS — C320 Malignant neoplasm of glottis: Secondary | ICD-10-CM | POA: Insufficient documentation

## 2015-01-24 DIAGNOSIS — Z510 Encounter for antineoplastic radiation therapy: Secondary | ICD-10-CM | POA: Insufficient documentation

## 2015-01-24 MED ORDER — LARYNGOSCOPY SOLUTION RAD-ONC
15.0000 mL | Freq: Once | TOPICAL | Status: AC
  Administered 2015-01-24: 15 mL via TOPICAL

## 2015-01-24 NOTE — Progress Notes (Signed)
Radiation Oncology         (336) [phone] ________________________________  Name: Shaun Henry MRN: [id number]  Date: 01/24/2015  DOB: [birth year]  Follow-Up Visit Note  CC: No PCP Per Patient  No ref. 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 Denies having a dry mouth, thick saliva. Denies difficulty swallowing. He denies fatigue. Pt states he get hoarse sometimes. His family says that his voice has virtually returned to normal.  Pain Status: no  Nutritional Status  1) Using a feeding tube?: No  2) Summary of daily intake: Eating "anything he wants"  3) Weight changes, if any: has gained 8 lbs since February.  Swallowing Status: Denies pain or difficulty upon swallowing  Smoking or chewing tobacco: smokes occasional cigar  Dental (if applicable): Next dental appointment is on: says he is going to schedule an appointment. Using fluoride trays daily? No  Summary of last ENT visit is: 06/17/14 "excellent appearing glottis with no exophytic lesion remaining. Thickening of the arytenoids and vocal cords. Normal voice." Does not have any future visits scheduled as reported by scheduler    Other notable issues, if any: Last TSH from 08/16/14 - 0.426.  ALLERGIES:  is allergic to no known allergies.  Meds: Current Outpatient Prescriptions  Medication Sig Dispense Refill  . simvastatin (ZOCOR) 40 MG tablet Take by mouth.    . triamterene-hydrochlorothiazide (DYAZIDE) 37.5-25 MG per capsule Take by mouth.     Current Facility-Administered Medications  Medication Dose Route Frequency Provider Last Rate Last Dose  . laryngocopy solution for Rad-Onc  15 mL Topical  Once Eppie Gibson, MD        Physical Findings: The patient is in no acute distress. Patient is alert and oriented.  height is 5\' 8"  (1.727 m) and weight is 249 lb 6.4 oz (113.127 kg). His oral temperature is 98.5 F (36.9 C). His blood pressure is 143/88 and his pulse is 82. His respiration is 16 and oxygen saturation is 97%. . No palpable adenopathy noted in the neck. Oropharyngeal mucosa is intact with no thrush or lesions.  Skin intact, with heterogeneous pigmentation over neck. Heart RRR. Lungs CTAB.  PROCEDURE NOTE: After anesthetizing the nasal cavity with topical lidocaine and phenylephrine, the flexible endoscope was introduced and passed through the nasal cavity. He has a somewhat tortuous larynx which causes the supraglottitis to obscure visualization of the majority of the true glottis. However, given this limited exam, I did not appreciate and supraglottic lesions or pharyngeal lesions.  Lab Findings: Lab Results  Component Value Date   WBC 4.9 02/01/2014   HGB 14.2 02/01/2014   HCT 43.8 02/01/2014   MCV 83.4 02/01/2014   PLT 198 02/01/2014    Lab Results  Component Value Date   TSH 0.426 08/16/2014    Radiographic Findings: No results found.  Impression/Plan:    1) Head and Neck Cancer Status: NED  2) Nutritional Status: doing well with PO intake  3) Risk Factors: The pt still smokes an occasional cigar. The patient has been educated about risk factors including alcohol and tobacco abuse; he understand that avoidance of  tobacco is important to prevent recurrences as well as other  cancers. We talked for a while about this  4) Swallowing: good  5) Energy: good, check TSH q6-12 mo. Lab Results  Component Value Date   TSH 0.426 08/16/2014     6) Social: No active social issues to address at this time  7)   Follow-up with me in December with TSH.  Gayleen Orem, RN, our Head and Neck Oncology Navigator will refer back to see Dr Janace Hoard in 2 mo  The patient was  encouraged to call with any issues or questions before then.  This document serves as a record of services personally performed by Eppie Gibson, MD. It was created on her behalf by Darcus Austin, a trained medical scribe. The creation of this record is based on the scribe's personal observations and the provider's statements to them. This document has been checked and approved by the attending provider.     _____________________________________   Eppie Gibson, MD

## 2015-01-24 NOTE — Addendum Note (Signed)
Encounter addended by: Jacqulyn Liner, RN on: 01/24/2015  5:37 PM<BR>     Documentation filed: Charges VN

## 2015-01-27 ENCOUNTER — Telehealth: Payer: Self-pay | Admitting: *Deleted

## 2015-01-27 NOTE — Telephone Encounter (Signed)
  Oncology Nurse Navigator Documentation   Navigator Encounter Type: Telephone (01/27/15 1151)    Per Dr. Pearlie Oyster guidance, called [facility].  Spoke with Shirlean Mylar, requested that patient be contacted and appt be arranged to see Dr. Janace Hoard in 2 months.  She verbalized understanding.   Gayleen Orem, RN, BSN, [place] at [place] [phone]

## 2016-04-19 ENCOUNTER — Other Ambulatory Visit: Payer: Self-pay | Admitting: Radiation Oncology

## 2016-04-19 DIAGNOSIS — C320 Malignant neoplasm of glottis: Secondary | ICD-10-CM

## 2016-04-19 DIAGNOSIS — R635 Abnormal weight gain: Secondary | ICD-10-CM

## 2016-04-23 ENCOUNTER — Telehealth: Payer: Self-pay | Admitting: *Deleted

## 2016-04-23 NOTE — Telephone Encounter (Signed)
RECEIVED PHONE CALL FROM PATIENT'S WIFE - EVA AND SHE WANTS APPT. AFTER 3 PM DUE TO HER HUSBAND'S WORK, [facility] DIDN'T HAVE ANYTHING ON 05-03-16, SO APPTS. HAVE BEEN RESCHEDULED FOR 05-17-16, PATIENT'S WIFE- EVA AGREED TO THIS

## 2016-05-03 ENCOUNTER — Ambulatory Visit: Payer: Commercial Managed Care - PPO | Admitting: Radiation Oncology

## 2016-05-03 ENCOUNTER — Encounter: Payer: Commercial Managed Care - PPO | Admitting: Adult Health

## 2016-05-03 ENCOUNTER — Ambulatory Visit: Payer: Commercial Managed Care - PPO

## 2016-05-03 ENCOUNTER — Ambulatory Visit: Payer: Self-pay | Admitting: Radiation Oncology

## 2016-05-07 NOTE — Progress Notes (Signed)
Shaun Henry presents for follow up of radiation completed 03/28/14 to his Glottis.   Pain issues, if any: No Using a feeding tube?: No Weight changes, if any:  Wt Readings from Last 3 Encounters:  05/17/16 253 lb 6.4 oz (114.9 kg)  01/24/15 249 lb 6.4 oz (113.1 kg)  08/16/14 241 lb 9.6 oz (109.6 kg)   Swallowing issues, if any: No Smoking or chewing tobacco? Occasionally cigars Using fluoride trays daily? No Last ENT visit was on: Dr. Janace Hoard 03/18/15  Other notable issues, if any:  BP 138/83   Pulse 80   Temp 98.1 F (36.7 C) (Oral)   Resp 18   Ht 5\' 8"  (1.727 m)   Wt 253 lb 6.4 oz (114.9 kg)   SpO2 99%   BMI 38.53 kg/m

## 2016-05-17 ENCOUNTER — Encounter: Payer: Self-pay | Admitting: Radiation Oncology

## 2016-05-17 ENCOUNTER — Ambulatory Visit
Admission: RE | Admit: 2016-05-17 | Discharge: 2016-05-17 | Disposition: A | Discharge status: Home / Self Care | Payer: Commercial Managed Care - PPO | Source: Ambulatory Visit | Attending: Radiation Oncology | Admitting: Radiation Oncology

## 2016-05-17 ENCOUNTER — Encounter: Payer: Commercial Managed Care - PPO | Admitting: Adult Health

## 2016-05-17 VITALS — BP 138/83 | HR 80 | Temp 98.1°F | Resp 18 | Ht 68.0 in | Wt 253.4 lb

## 2016-05-17 DIAGNOSIS — C320 Malignant neoplasm of glottis: Secondary | ICD-10-CM | POA: Diagnosis not present

## 2016-05-17 DIAGNOSIS — R635 Abnormal weight gain: Secondary | ICD-10-CM | POA: Diagnosis not present

## 2016-05-17 DIAGNOSIS — Z79899 Other long term (current) drug therapy: Secondary | ICD-10-CM | POA: Diagnosis not present

## 2016-05-17 DIAGNOSIS — F17290 Nicotine dependence, other tobacco product, uncomplicated: Secondary | ICD-10-CM | POA: Diagnosis not present

## 2016-05-17 DIAGNOSIS — Z08 Encounter for follow-up examination after completed treatment for malignant neoplasm: Secondary | ICD-10-CM | POA: Diagnosis not present

## 2016-05-17 DIAGNOSIS — Z8521 Personal history of malignant neoplasm of larynx: Secondary | ICD-10-CM | POA: Diagnosis not present

## 2016-05-17 MED ORDER — LARYNGOSCOPY SOLUTION RAD-ONC
15.0000 mL | Freq: Once | TOPICAL | Status: AC
  Administered 2016-05-17: 15 mL via TOPICAL

## 2016-05-17 NOTE — Progress Notes (Signed)
Radiation Oncology         [phone] ________________________________  Name: Shaun Henry MRN: [id number]  Date: 05/17/2016  DOB: 01/21/1946  Follow-Up Visit Note  CC: No PCP Per Patient  Melissa Montane, MD  Diagnosis and Prior Radiotherapy:   T2N0M0 Glottic Squamous Cell Carcinoma   ICD-9-CM ICD-10-CM   1. Glottis carcinoma (HCC) 161.0 C32.0 laryngocopy solution for Rad-Onc     Fiberoptic laryngoscopy  2. Weight gain 783.1 R63.5    Radiation treatment dates:   02/14/2014-03/28/2014  Site/dose:   Glottis / 65.25 Gy in 29 fractions  Chief Complaint: Here for follow-up and surveillance of glottic cancer.  Narrative:  The patient returns today for routine follow-up.  He no showed for previously follow-ups and we were able to get back in touch with him after he moved.  Last appt with Dr Janace Hoard >1 yr ago, 03-2015.  Occasional smokes cigars.  No pain, no masses, no swallowing issues. Gained 4 lb over last year.  Not hoarse. Working.  ALLERGIES:  is allergic to no known allergies.  Meds: Current Outpatient Prescriptions  Medication Sig Dispense Refill  . simvastatin (ZOCOR) 40 MG tablet Take by mouth.    . triamterene-hydrochlorothiazide (DYAZIDE) 37.5-25 MG per capsule Take by mouth.    . nicotine (NICODERM CQ) 14 mg/24hr patch apply 14 mg patch daily x6wk, then 7 mg patch daily x2wk 14 patch 2  . nicotine (NICODERM CQ) 7 mg/24hr patch Apply 14 mg patch daily x6wk, then 7 mg patch daily x2wk 14 patch 0   No current facility-administered medications for this encounter.     Physical Findings: The patient is in no acute distress. Patient is alert and oriented.  height is 5\' 8"  (1.727 m) and weight is 253 lb 6.4 oz (114.9 kg). His oral temperature is 98.1 F (36.7 C). His blood pressure is 138/83 and his pulse is 80. His respiration is 18 and oxygen saturation is 99%. .  No palpable adenopathy noted in the neck.  Oropharyngeal mucosa is intact with no thrush or lesions.   Skin intact,  with heterogeneous pigmentation over neck.  Heart RRR.  Lungs CTAB.  PROCEDURE NOTE: After obtaining consent and anesthetizing the nasal cavity with topical lidocaine and phenylephrine, the flexible endoscope was introduced and passed through the nasal cavity. He has a tortuous larynx which causes the supraglottitis to obscure visualization of the left glottis. This is consistent with last exam.  I did not appreciate any supraglottic lesions, right glottic lesions, or pharyngeal lesions.  Lab Findings: Lab Results  Component Value Date   WBC 4.9 02/01/2014   HGB 14.2 02/01/2014   HCT 43.8 02/01/2014   MCV 83.4 02/01/2014   PLT 198 02/01/2014    Lab Results  Component Value Date   TSH 0.662 05/17/2016    Radiographic Findings: No results found.  Impression/Plan:    1) Head and Neck Cancer Status: NED  2) Nutritional Status: doing well with PO intake, gaining weight  3) Risk Factors:  I asked the patient today about tobacco use. The patient uses tobacco.  The pt still smokes 2-3 daily cigars. I advised the patient to quit. Services were offered by me today including outpatient counseling and pharmacotherapy. I assessed for the willingness to attempt to quit and provided encouragement and demonstrated willingness to make referrals and/or prescriptions to help the patient attempt to quit. The patient has follow-up with the oncologic team to touch base on their tobacco use and /or cessation efforts.  He  plans to quit on 06-17-16 using the nicotine patch. Rxs made to [facility] in [place].  Over 3 minutes were spent on this issue.    4) Swallowing: good  5) Energy: good, check TSH q6-12 mo. Lab Results  Component Value Date   TSH 0.662 05/17/2016    6) Social: No active social issues to address at this time  7)   Follow-up with survivorship in 1 year with TSH.  I can perform laryngoscopy at that time.  Gayleen Orem, RN, our Head and Neck Oncology Navigator will refer back to see Dr  Janace Hoard in 6 mo   The patient was encouraged to call with any issues or questions before then.  _____________________________________   Eppie Gibson, MD

## 2016-05-18 ENCOUNTER — Other Ambulatory Visit: Payer: Self-pay | Admitting: Radiation Oncology

## 2016-05-18 ENCOUNTER — Telehealth: Payer: Self-pay

## 2016-05-18 DIAGNOSIS — Z720 Tobacco use: Secondary | ICD-10-CM

## 2016-05-18 DIAGNOSIS — C320 Malignant neoplasm of glottis: Secondary | ICD-10-CM

## 2016-05-18 DIAGNOSIS — R635 Abnormal weight gain: Secondary | ICD-10-CM

## 2016-05-18 LAB — TSH: TSH: 0.662 m(IU)/L (ref 0.320–4.118)

## 2016-05-18 MED ORDER — NICOTINE 7 MG/24HR TD PT24: MEDICATED_PATCH | TRANSDERMAL | 0 refills | Status: DC

## 2016-05-18 MED ORDER — NICOTINE 14 MG/24HR TD PT24
MEDICATED_PATCH | TRANSDERMAL | 2 refills | Status: DC
Start: 2016-05-18 — End: 2019-09-18

## 2016-05-18 NOTE — Telephone Encounter (Signed)
I called and left a voice mail with Mr. Shaun Henry. I informed him that his TSH level was fine, and Dr. Isidore Moos sent a prescription for nicotine patches to [facility] [address] in [place]. I left my phone number to call back if he had any further questions.

## 2016-05-19 ENCOUNTER — Telehealth: Payer: Self-pay | Admitting: *Deleted

## 2016-05-19 NOTE — Telephone Encounter (Signed)
Oncology Nurse Navigator Documentation  Per Dr. Pearlie Oyster guidance, called [facility] to arrange routine post-treatment follow-up visit for Mr. Shaun Henry.  Spoke with Michel Bickers, requested that he be contacted and appt arranged to see Dr. Janace Hoard in 6 months.  She verbalized understanding.  Gayleen Orem, RN, BSN, [place] [facility] [place] [phone]

## 2016-07-16 IMAGING — CT CT CHEST W/ CM
4 of 7 series · 18 of 36 positions shown, 19 images · IV contrast (OMNIPAQUE)
Comparison: Nuclear medicine thyroid scan 02/15/2003

CLINICAL DATA: Glottic carcinoma

EXAM:
CT CHEST WITH CONTRAST
TECHNIQUE: Multidetector CT imaging of the chest was performed during
intravenous contrast administration.
CONTRAST:  100mL OMNIPAQUE IOHEXOL 300 MG/ML  SOLN

[Series 2: chest st · axial · 0.74mm/px · z∈[-426,-236]mm · 4 of 64 slices shown, 5 images]
[im 13/64  mediastinal]
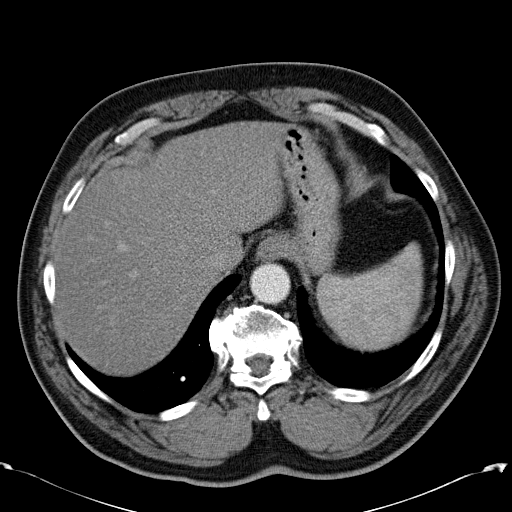
[im 13/64  lung]
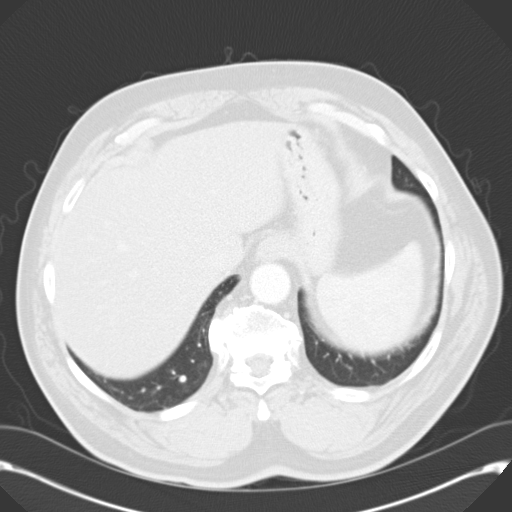
[im 26/64  lung]
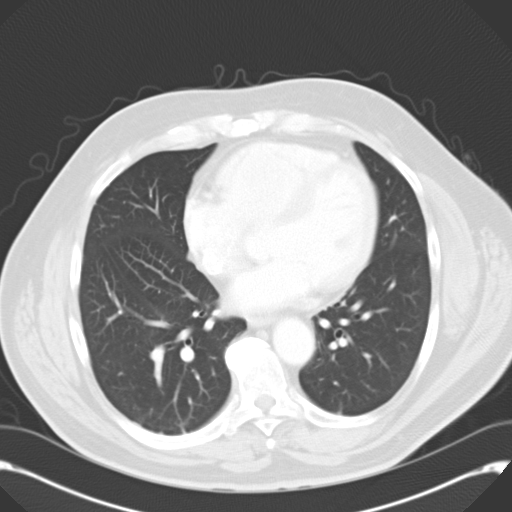
[im 38/64  lung]
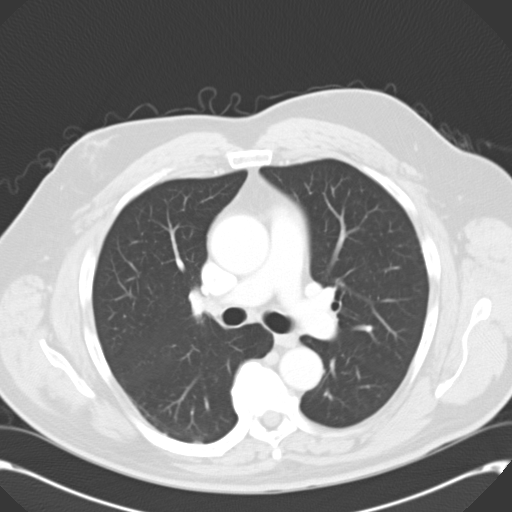
[im 51/64  lung]
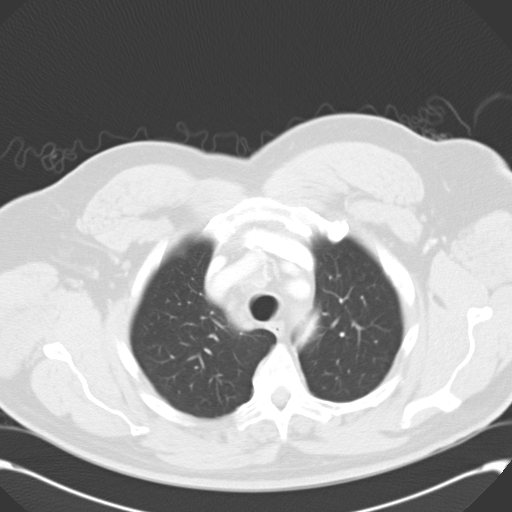

[Series 4: neck st · axial · 0.39mm/px · z∈[-230,-40]mm · 8 of 119 slices shown]
[im 12/119  lung]
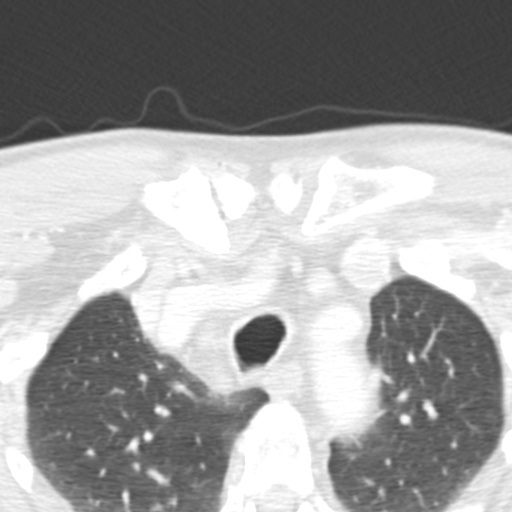
[im 24/119  lung]
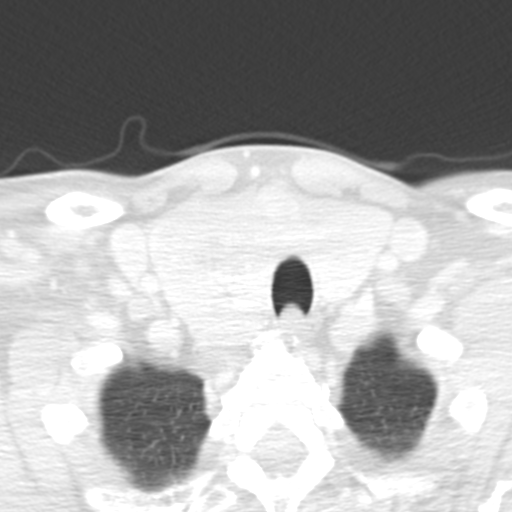
[im 36/119  lung]
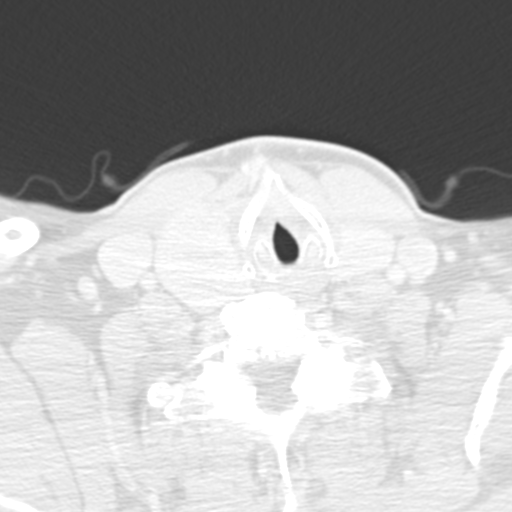
[im 48/119  lung]
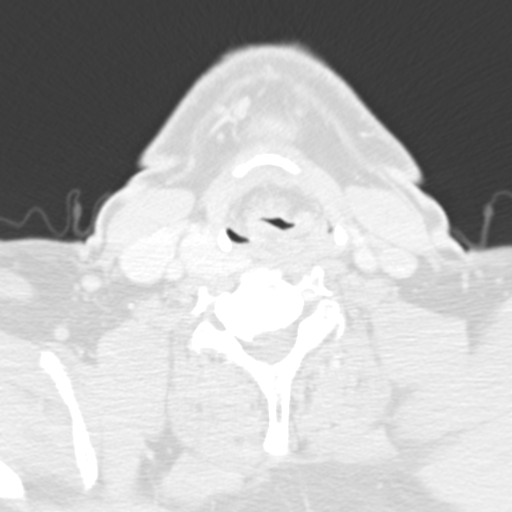
[im 71/119  lung]
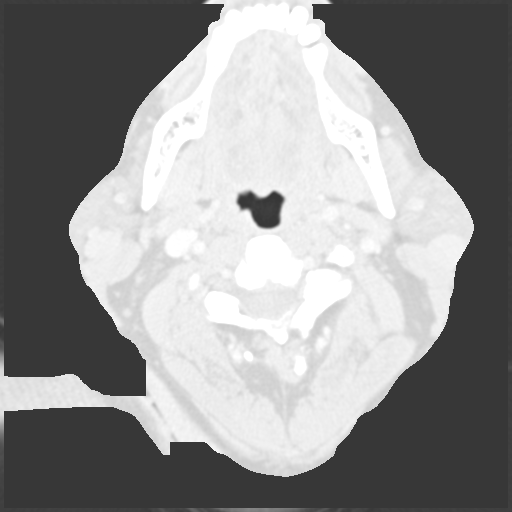
[im 83/119  lung]
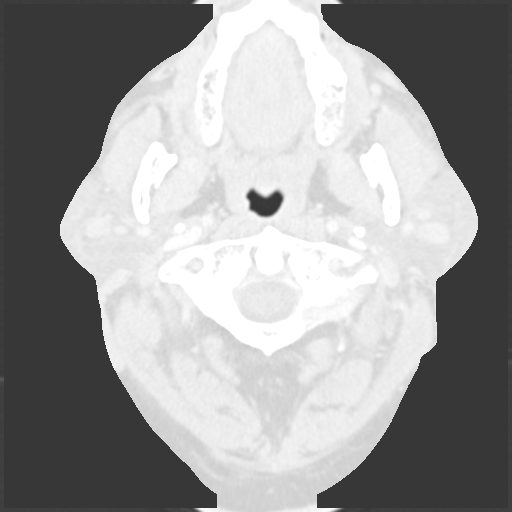
[im 95/119  lung]
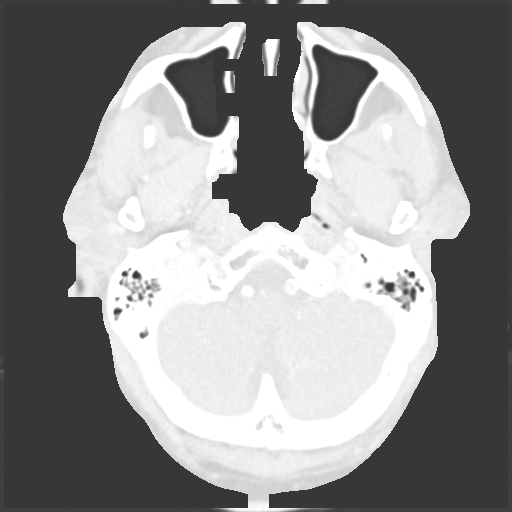
[im 107/119  lung]
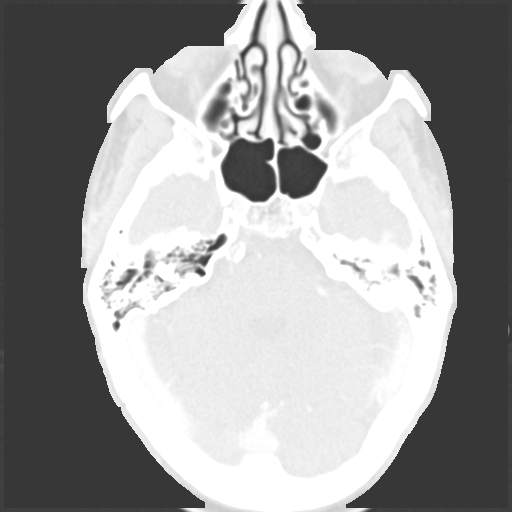

[Series 602: <mpr thick range> · axial · 0.46mm/px · z∈[-231,-112]mm · 5 of 119 slices shown]
[im 12/119  lung]
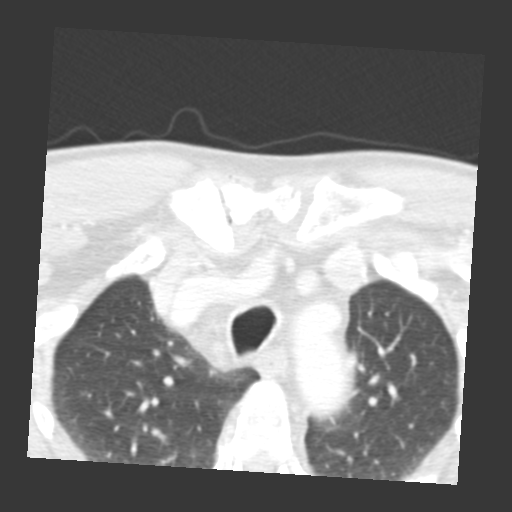
[im 24/119  lung]
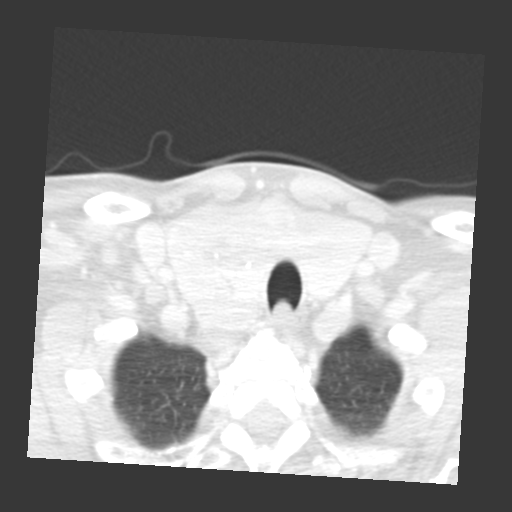
[im 36/119  lung]
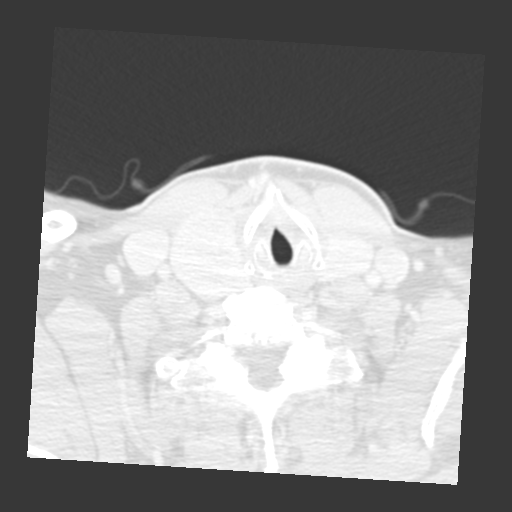
[im 48/119  lung]
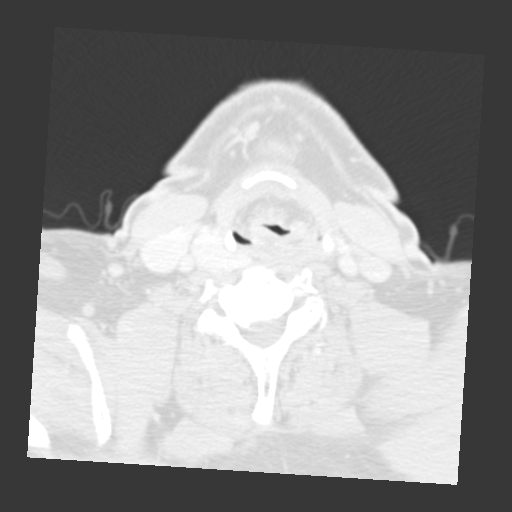
[im 71/119  lung]
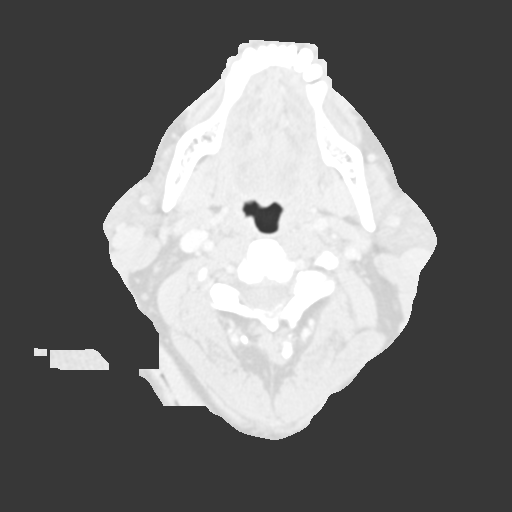

[Series 603: <mpr thick range(1)> · coronal · 0.46mm/px · 1 of 105 slices shown]
[im 53/105  lung]
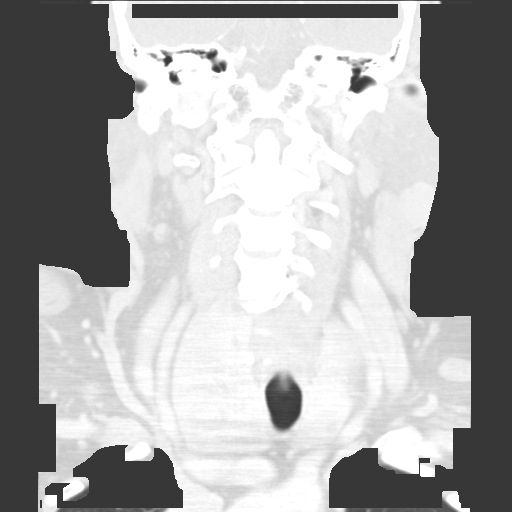

[18 of 36 positions shown; findings below may reference images not displayed]

FINDINGS: There is no axillary or supraclavicular lymphadenopathy. There is
enlargement of the of the right lobe of thyroid gland. This
enlargement is increased minimally nuclear medicine scan 5228 with a
substernal extension along the right side of the trachea.

There is no mediastinal lymphadenopathy. No pericardial fluid.
Coronary calcifications are noted. Esophagus is normal.

Review of the lung parenchyma demonstrates no suspicious pulmonary
nodules. Limited view of the upper abdomen is unremarkable. Limited
view of the skeleton demonstrates degenerative spurring of the
spine.
IMPRESSION: 1. No evidence of metastatic adenopathy in the thorax.
2. No pulmonary nodules.
3. Goiterous enlargement of the thyroid gland with substernal
extension on the right.
Please see accompanying CT neck dictation.

## 2016-11-02 LAB — COLOGUARD: Cologuard: NEGATIVE

## 2016-11-04 LAB — COLOGUARD: Cologuard: NEGATIVE

## 2017-04-25 DIAGNOSIS — I10 Essential (primary) hypertension: Secondary | ICD-10-CM | POA: Diagnosis not present

## 2017-04-25 DIAGNOSIS — E119 Type 2 diabetes mellitus without complications: Secondary | ICD-10-CM | POA: Diagnosis not present

## 2017-04-25 DIAGNOSIS — E782 Mixed hyperlipidemia: Secondary | ICD-10-CM | POA: Diagnosis not present

## 2017-10-24 DIAGNOSIS — E782 Mixed hyperlipidemia: Secondary | ICD-10-CM | POA: Diagnosis not present

## 2017-10-24 DIAGNOSIS — I10 Essential (primary) hypertension: Secondary | ICD-10-CM | POA: Diagnosis not present

## 2017-10-24 DIAGNOSIS — E119 Type 2 diabetes mellitus without complications: Secondary | ICD-10-CM | POA: Diagnosis not present

## 2018-04-25 DIAGNOSIS — E782 Mixed hyperlipidemia: Secondary | ICD-10-CM | POA: Diagnosis not present

## 2018-04-25 DIAGNOSIS — E1169 Type 2 diabetes mellitus with other specified complication: Secondary | ICD-10-CM | POA: Diagnosis not present

## 2018-04-25 DIAGNOSIS — I10 Essential (primary) hypertension: Secondary | ICD-10-CM | POA: Diagnosis not present

## 2019-09-18 ENCOUNTER — Encounter: Payer: Self-pay | Admitting: Nurse Practitioner

## 2019-09-18 ENCOUNTER — Ambulatory Visit (INDEPENDENT_AMBULATORY_CARE_PROVIDER_SITE_OTHER): Payer: Commercial Managed Care - PPO | Admitting: Nurse Practitioner

## 2019-09-18 ENCOUNTER — Other Ambulatory Visit: Payer: Self-pay

## 2019-09-18 VITALS — BP 130/68 | HR 84 | Temp 97.3°F | Resp 18 | Ht 71.0 in | Wt 217.0 lb

## 2019-09-18 DIAGNOSIS — C320 Malignant neoplasm of glottis: Secondary | ICD-10-CM

## 2019-09-18 DIAGNOSIS — F17219 Nicotine dependence, cigarettes, with unspecified nicotine-induced disorders: Secondary | ICD-10-CM

## 2019-09-18 DIAGNOSIS — E782 Mixed hyperlipidemia: Secondary | ICD-10-CM

## 2019-09-18 DIAGNOSIS — E663 Overweight: Secondary | ICD-10-CM | POA: Diagnosis not present

## 2019-09-18 DIAGNOSIS — F17200 Nicotine dependence, unspecified, uncomplicated: Secondary | ICD-10-CM | POA: Diagnosis not present

## 2019-09-18 DIAGNOSIS — E785 Hyperlipidemia, unspecified: Secondary | ICD-10-CM | POA: Diagnosis not present

## 2019-09-18 DIAGNOSIS — R499 Unspecified voice and resonance disorder: Secondary | ICD-10-CM | POA: Insufficient documentation

## 2019-09-18 DIAGNOSIS — I10 Essential (primary) hypertension: Secondary | ICD-10-CM | POA: Diagnosis not present

## 2019-09-18 DIAGNOSIS — E1169 Type 2 diabetes mellitus with other specified complication: Secondary | ICD-10-CM | POA: Diagnosis not present

## 2019-09-18 DIAGNOSIS — E119 Type 2 diabetes mellitus without complications: Secondary | ICD-10-CM | POA: Insufficient documentation

## 2019-09-18 NOTE — Assessment & Plan Note (Signed)
Well controlled.  ?No changes to medicines.  ?Continue to work on eating a healthy diet and exercise.  ?Labs drawn today.  ?

## 2019-09-18 NOTE — Progress Notes (Signed)
Established Patient Office Visit  Subjective:  Patient ID: Shaun Henry, male    DOB: 16-Dec-1945  Age: 74 y.o. MRN: [id number]  CC: Patietnt  is a 74 year old male. He is here for 6 months f/u  The patient's medications were reviewed and reconciled since the patient's last visit.  History details were provided by the patient. The history appears to be reliable.   Chief Complaint  Patient presents with  . Follow-up  . Hyperlipidemia  . Hypertension  . Diabetes    HPI Shaun Henry  presents with hyperlipidemia. Current treatment is simvastatin. Compliance with treatment has been good; he takes his medication as directed, maintains his low cholesterol diet, follows up as directed, and maintains his exercise regimen.  He denies experiencing any hypercholesterolemia related symptoms.  Most recent lab tests include total cholesterol level ( fasting 139 mg/dl), triglycerides ( 101 mg/dl ), HDL ( 34 mg/dL ), and LDL cholesterol ( 85 mg/dl ).  Concurrent health problems include hypertension.      Pt presents for follow up of hypertension. First diagnosed in 2011.  His current cardiac medication regimen includes Triamterene/HCTZ 37.5/25 mg QD. Patient doent have his blood pressure log with him today. , he reports Blood pressure ranges in [id number] and diastolics in the [id number] range.  He is tolerating the medication well without side effects.  Compliance with treatment has been good; he takes his medication as directed, maintains his diet and exercise regimen, and follows up as directed.      Concerning His  type 2 diabetes mellitus with other specified complication,  this is type 2, non-insulin requiring diabetes, complicated by hypertension and hyperlipidemia.  Date of first  diagnosis 10/2016.  Compliance with treatment has been good; he takes his medication as directed, maintains his diet and exercise regimen, and follows up as directed.  He specifically denies associated symptoms, including blurred vision,  fatigue, hypoglycemia, nocturia, peripheral neuropathy, excessive thirst and frequent urination.  Depression screen is performed and is negative.  Tobacco screen: Current smoker.   Current meds include an oral hypoglycemic ( Glucophage ( 500mg  bid ) ), daily aspirin ( 81mg  ), and lipid lowering agent ( Zocor ).  He does not perform home blood glucose monitoring.  Most recent lab results include glycohemoglobin 7.5%.  His most recent systolic blood pressure reading 130/68. Concurrent health problems include hypertension and hypercholesterolemia.    Past Medical History:  Diagnosis Date  . Hypertension   . Mixed hyperlipidemia   . S/P radiation therapy 02/14/2014-03/28/2014   glottis/65.25 Gy 29 fx  . 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 Type 2 diabetes mellitus with other specified complication [facility])     Past Surgical History:  Procedure Laterality Date  . 3rd right finger surgery      Family History  Problem Relation Age of Onset  . Coronary artery disease Father     Social History   Socioeconomic History  . Marital status: Married    Spouse name: Not on file  . Number of children: 2  . Years of education: Not on file  . Highest education level: Not on file  Occupational History  . Occupation: Truck Geophysicist/field seismologist  Tobacco Use  . Smoking status: Current Some Day Smoker    Years: 10.00    Types: Cigars  . Smokeless tobacco: Never Used  . Tobacco comment: occassional smoker Cigars.  Average 5 per week  Substance and Sexual Activity  .  Alcohol use: No  . Drug use: No  . Sexual activity: Not on file  Other Topics Concern  . Not on file  Social History Narrative  . Not on file   Social Determinants of Health   Financial Resource Strain:   . Difficulty of Paying Living Expenses:   Food Insecurity:   . Worried About Charity fundraiser in the Last Year:   . Arboriculturist in the Last Year:   Transportation Needs:   . Lexicographer (Medical):   [name] Lack of Transportation (Non-Medical):   Physical Activity:   . Days of Exercise per Week:   . Minutes of Exercise per Session:   Stress:   . Feeling of Stress :   Social Connections:   . Frequency of Communication with Friends and Family:   . Frequency of Social Gatherings with Friends and Family:   . Attends Religious Services:   . Active Member of Clubs or Organizations:   . Attends Archivist Meetings:   [name] Marital Status:   Intimate Partner Violence:   . Fear of Current or Ex-Partner:   . Emotionally Abused:   [name] Physically Abused:   . Sexually Abused:     Outpatient Medications Prior to Visit  Medication Sig Dispense Refill  . aspirin EC 81 MG tablet Take 81 mg by mouth daily.    . cyclobenzaprine (FLEXERIL) 10 MG tablet Take 10 mg by mouth 3 (three) times daily as needed for muscle spasms.    . metFORMIN (GLUCOPHAGE) 1000 MG tablet Take 1,000 mg by mouth 2 (two) times daily.    . simvastatin (ZOCOR) 40 MG tablet Take by mouth.    . triamterene-hydrochlorothiazide (DYAZIDE) 37.5-25 MG per capsule Take by mouth.    . nicotine (NICODERM CQ) 14 mg/24hr patch apply 14 mg patch daily x6wk, then 7 mg patch daily x2wk 14 patch 2  . nicotine (NICODERM CQ) 7 mg/24hr patch Apply 14 mg patch daily x6wk, then 7 mg patch daily x2wk 14 patch 0   No facility-administered medications prior to visit.    No Known Allergies  ROS Review of Systems  Constitutional: Negative for activity change, appetite change, diaphoresis and fatigue.  HENT: Negative for congestion, drooling, ear discharge, ear pain, facial swelling, postnasal drip and sinus pain.   Eyes: Positive for redness. Negative for pain, discharge and itching.  Respiratory: Negative for cough, chest tightness and shortness of breath.   Cardiovascular: Negative for chest pain and palpitations.  Gastrointestinal: Negative for abdominal distention, abdominal pain, constipation, diarrhea and  nausea.  Genitourinary: Negative for difficulty urinating.  Musculoskeletal: Negative for arthralgias, myalgias, neck pain and neck stiffness.  Skin: Negative for rash.  Neurological: Negative for facial asymmetry, speech difficulty, light-headedness and headaches.  Psychiatric/Behavioral: Negative for agitation. The patient is not nervous/anxious.       Objective:    Physical Exam  Constitutional: He is oriented to person, place, and time. He appears well-developed and well-nourished. No distress.  HENT:  Head: Normocephalic.  Right Ear: External ear normal.  Left Ear: External ear normal.  Nose: Nose normal.  Mouth/Throat: No oropharyngeal exudate.  Eyes: Pupils are equal, round, and reactive to light. Conjunctivae and EOM are normal. Right eye exhibits no discharge. Left eye exhibits no discharge.  Cardiovascular: Normal rate, regular rhythm and normal heart sounds.  Pulmonary/Chest: Effort normal and breath sounds normal.  Abdominal: Soft. Bowel sounds are normal. There is no abdominal tenderness.  Musculoskeletal:  General: Normal range of motion.     Cervical back: Normal range of motion and neck supple.  Neurological: He is alert and oriented to person, place, and time.  Skin: Skin is warm. No rash noted. He is not diaphoretic.  Psychiatric: He has a normal mood and affect. Judgment normal.    BP 130/68 (BP Location: Left Arm, Patient Position: Sitting)   Pulse 84   Temp (!) 97.3 F (36.3 C) (Temporal)   Resp 18   Ht 5\' 11"  (1.803 m)   Wt 217 lb (98.4 kg)   BMI 30.27 kg/m  Wt Readings from Last 3 Encounters:  09/18/19 217 lb (98.4 kg)  05/17/16 253 lb 6.4 oz (114.9 kg)  01/24/15 249 lb 6.4 oz (113.1 kg)     Health Maintenance Due  Topic Date Due  . HEMOGLOBIN A1C  Never done  . Hepatitis C Screening  Never done  . FOOT EXAM  Never done  . OPHTHALMOLOGY EXAM  Never done  . URINE MICROALBUMIN  Never done  . TETANUS/TDAP  Never done  . PNA vac Low Risk  Adult (1 of 2 - PCV13) Never done    There are no preventive care reminders to display for this patient.  Lab Results  Component Value Date   TSH 0.662 05/17/2016   Lab Results  Component Value Date   WBC 4.9 02/01/2014   HGB 14.2 02/01/2014   HCT 43.8 02/01/2014   MCV 83.4 02/01/2014   PLT 198 02/01/2014   Lab Results  Component Value Date   NA 140 02/01/2014   K 4.1 02/01/2014   CHLORIDE 104 02/01/2014   CO2 27 02/01/2014   GLUCOSE 95 02/01/2014   BUN 16.6 02/01/2014   CREATININE 1.2 02/01/2014   CALCIUM 10.0 02/01/2014   ANIONGAP 9 02/01/2014   No results found for: CHOL No results found for: HDL No results found for: LDLCALC No results found for: TRIG No results found for: CHOLHDL No results found for: HGBA1C    Assessment & Plan:   Problem List Items Addressed This Visit      Cardiovascular and Mediastinum   Essential hypertension - Primary    Well controlled.  No changes to medicines.  Continue to work on eating a healthy diet and exercise.  Labs drawn today.       Relevant Medications   aspirin EC 81 MG tablet   Other Relevant Orders   CBC with Differential/Platelet   Lipid Panel     Respiratory   Glottis carcinoma (HCC)    Patient is followed by oncologist, advised to keep all appointments      Relevant Medications   aspirin EC 81 MG tablet     Endocrine   Diabetes mellitus without complication ([place])    Well controlled.  No changes to medicines.  Continue to work on eating a healthy diet and exercise.  Labs drawn today.       Relevant Medications   metFORMIN (GLUCOPHAGE) 1000 MG tablet   aspirin EC 81 MG tablet     Nervous and Auditory   Cigarette nicotine dependence with nicotine-induced disorder     Other   Mixed hyperlipidemia    Well controlled.  No changes to medicines.  Continue to work on eating a healthy diet and exercise.  Labs drawn today.       Relevant Medications   aspirin EC 81 MG tablet   Other Relevant  Orders   Comprehensive metabolic panel   TSH  Overweight with body mass index (BMI) 25.0-29.9      Follow-up: Return in about 3 months (around 12/19/2019).    Ivy Lynn, NP

## 2019-09-18 NOTE — Patient Instructions (Addendum)
Mixed hyperlipidemia Well controlled.  No changes to medicines.  Continue to work on eating a healthy diet and exercise.  Labs drawn today.   Diabetes mellitus without complication ([place]) Well controlled.  No changes to medicines.  Continue to work on eating a healthy diet and exercise.  Labs drawn today.   Essential hypertension Well controlled.  No changes to medicines.  Continue to work on eating a healthy diet and exercise.  Labs drawn today.      Hypertension, Adult Hypertension is another name for high blood pressure. High blood pressure forces your heart to work harder to pump blood. This can cause problems over time. There are two numbers in a blood pressure reading. There is a top number (systolic) over a bottom number (diastolic). It is best to have a blood pressure that is below 120/80. Healthy choices can help lower your blood pressure, or you may need medicine to help lower it. What are the causes? The cause of this condition is not known. Some conditions may be related to high blood pressure. What increases the risk?  Smoking.  Having type 2 diabetes mellitus, high cholesterol, or both.  Not getting enough exercise or physical activity.  Being overweight.  Having too much fat, sugar, calories, or salt (sodium) in your diet.  Drinking too much alcohol.  Having long-term (chronic) kidney disease.  Having a family history of high blood pressure.  Age. Risk increases with age.  Race. You may be at higher risk if you are African American.  Gender. Men are at higher risk than women before age 82. After age 62, women are at higher risk than men.  Having obstructive sleep apnea.  Stress. What are the signs or symptoms?  High blood pressure may not cause symptoms. Very high blood pressure (hypertensive crisis) may cause: ? Headache. ? Feelings of worry or nervousness (anxiety). ? Shortness of breath. ? Nosebleed. ? A feeling of being sick to your  stomach (nausea). ? Throwing up (vomiting). ? Changes in how you see. ? Very bad chest pain. ? Seizures. How is this treated?  This condition is treated by making healthy lifestyle changes, such as: ? Eating healthy foods. ? Exercising more. ? Drinking less alcohol.  Your health care provider may prescribe medicine if lifestyle changes are not enough to get your blood pressure under control, and if: ? Your top number is above 130. ? Your bottom number is above 80.  Your personal target blood pressure may vary. Follow these instructions at home: Eating and drinking   If told, follow the DASH eating plan. To follow this plan: ? Fill one half of your plate at each meal with fruits and vegetables. ? Fill one fourth of your plate at each meal with whole grains. Whole grains include whole-wheat pasta, brown rice, and whole-grain bread. ? Eat or drink low-fat dairy products, such as skim milk or low-fat yogurt. ? Fill one fourth of your plate at each meal with low-fat (lean) proteins. Low-fat proteins include fish, chicken without skin, eggs, beans, and tofu. ? Avoid fatty meat, cured and processed meat, or chicken with skin. ? Avoid pre-made or processed food.  Eat less than 1,500 mg of salt each day.  Do not drink alcohol if: ? Your doctor tells you not to drink. ? You are pregnant, may be pregnant, or are planning to become pregnant.  If you drink alcohol: ? Limit how much you use to:  0-1 drink a day for women.  0-2  drinks a day for men. ? Be aware of how much alcohol is in your drink. In the [place] one drink equals one 12 oz bottle of beer (355 mL), one 5 oz glass of wine (148 mL), or one 1 oz glass of hard liquor (44 mL). Lifestyle   Work with your doctor to stay at a healthy weight or to lose weight. Ask your doctor what the best weight is for you.  Get at least 30 minutes of exercise most days of the week. This may include walking, swimming, or biking.  Get at least  30 minutes of exercise that strengthens your muscles (resistance exercise) at least 3 days a week. This may include lifting weights or doing Pilates.  Do not use any products that contain nicotine or tobacco, such as cigarettes, e-cigarettes, and chewing tobacco. If you need help quitting, ask your doctor.  Check your blood pressure at home as told by your doctor.  Keep all follow-up visits as told by your doctor. This is important. Medicines  Take over-the-counter and prescription medicines only as told by your doctor. Follow directions carefully.  Do not skip doses of blood pressure medicine. The medicine does not work as well if you skip doses. Skipping doses also puts you at risk for problems.  Ask your doctor about side effects or reactions to medicines that you should watch for. Contact a doctor if you:  Think you are having a reaction to the medicine you are taking.  Have headaches that keep coming back (recurring).  Feel dizzy.  Have swelling in your ankles.  Have trouble with your vision. Get help right away if you:  Get a very bad headache.  Start to feel mixed up (confused).  Feel weak or numb.  Feel faint.  Have very bad pain in your: ? Chest. ? Belly (abdomen).  Throw up more than once.  Have trouble breathing. Summary  Hypertension is another name for high blood pressure.  High blood pressure forces your heart to work harder to pump blood.  For most people, a normal blood pressure is less than 120/80.  Making healthy choices can help lower blood pressure. If your blood pressure does not get lower with healthy choices, you may need to take medicine. This information is not intended to replace advice given to you by your health care provider. Make sure you discuss any questions you have with your health care provider. Document Revised: 03/08/2018 Document Reviewed: 03/08/2018 Elsevier Patient Education  [place]. High Cholesterol  High  cholesterol is a condition in which the blood has high levels of a white, waxy, fat-like substance (cholesterol). The human body needs small amounts of cholesterol. The liver makes all the cholesterol that the body needs. Extra (excess) cholesterol comes from the food that we eat. Cholesterol is carried from the liver by the blood through the blood vessels. If you have high cholesterol, deposits (plaques) may build up on the walls of your blood vessels (arteries). Plaques make the arteries narrower and stiffer. Cholesterol plaques increase your risk for heart attack and stroke. Work with your health care provider to keep your cholesterol levels in a healthy range. What increases the risk? This condition is more likely to develop in people who:  Eat foods that are high in animal fat (saturated fat) or cholesterol.  Are overweight.  Are not getting enough exercise.  Have a family history of high cholesterol. What are the signs or symptoms? There are no symptoms of this condition.  How is this diagnosed? This condition may be diagnosed from the results of a blood test.  If you are older than age 55, your health care provider may check your cholesterol every 4-6 years.  You may be checked more often if you already have high cholesterol or other risk factors for heart disease. The blood test for cholesterol measures:  "Bad" cholesterol (LDL cholesterol). This is the main type of cholesterol that causes heart disease. The desired level for LDL is less than 100.  "Good" cholesterol (HDL cholesterol). This type helps to protect against heart disease by cleaning the arteries and carrying the LDL away. The desired level for HDL is 60 or higher.  Triglycerides. These are fats that the body can store or burn for energy. The desired number for triglycerides is lower than 150.  Total cholesterol. This is a measure of the total amount of cholesterol in your blood, including LDL cholesterol, HDL  cholesterol, and triglycerides. A healthy number is less than 200. How is this treated? This condition is treated with diet changes, lifestyle changes, and medicines. Diet changes  This may include eating more whole grains, fruits, vegetables, nuts, and fish.  This may also include cutting back on red meat and foods that have a lot of added sugar. Lifestyle changes  Changes may include getting at least 40 minutes of aerobic exercise 3 times a week. Aerobic exercises include walking, biking, and swimming. Aerobic exercise along with a healthy diet can help you maintain a healthy weight.  Changes may also include quitting smoking. Medicines  Medicines are usually given if diet and lifestyle changes have failed to reduce your cholesterol to healthy levels.  Your health care provider may prescribe a statin medicine. Statin medicines have been shown to reduce cholesterol, which can reduce the risk of heart disease. Follow these instructions at home: Eating and drinking If told by your health care provider:  Eat chicken (without skin), fish, veal, shellfish, ground [place] breast, and round or loin cuts of red meat.  Do not eat fried foods or fatty meats, such as hot dogs and salami.  Eat plenty of fruits, such as apples.  Eat plenty of vegetables, such as broccoli, potatoes, and carrots.  Eat beans, peas, and lentils.  Eat grains such as barley, rice, couscous, and bulgur wheat.  Eat pasta without cream sauces.  Use skim or nonfat milk, and eat low-fat or nonfat yogurt and cheeses.  Do not eat or drink whole milk, cream, ice cream, egg yolks, or hard cheeses.  Do not eat stick margarine or tub margarines that contain trans fats (also called partially hydrogenated oils).  Do not eat saturated tropical oils, such as coconut oil and palm oil.  Do not eat cakes, cookies, crackers, or other baked goods that contain trans fats.  General instructions  Exercise as directed by your  health care provider. Increase your activity level with activities such as gardening, walking, and taking the stairs.  Take over-the-counter and prescription medicines only as told by your health care provider.  Do not use any products that contain nicotine or tobacco, such as cigarettes and e-cigarettes. If you need help quitting, ask your health care provider.  Keep all follow-up visits as told by your health care provider. This is important. Contact a health care provider if:  You are struggling to maintain a healthy diet or weight.  You need help to start on an exercise program.  You need help to stop smoking. Get help right away  if:  You have chest pain.  You have trouble breathing. This information is not intended to replace advice given to you by your health care provider. Make sure you discuss any questions you have with your health care provider. Document Revised: 07/01/2017 Document Reviewed: 12/27/2015 Elsevier Patient Education  [place].

## 2019-10-11 DIAGNOSIS — E663 Overweight: Secondary | ICD-10-CM | POA: Insufficient documentation

## 2019-10-11 DIAGNOSIS — F17200 Nicotine dependence, unspecified, uncomplicated: Secondary | ICD-10-CM | POA: Insufficient documentation

## 2019-10-11 NOTE — Assessment & Plan Note (Signed)
Patient is followed by oncologist, advised to keep all appointments

## 2019-11-13 ENCOUNTER — Other Ambulatory Visit: Payer: Self-pay

## 2019-11-13 MED ORDER — METFORMIN HCL 1000 MG PO TABS: 1000.0000 mg | ORAL_TABLET | Freq: Two times a day (BID) | ORAL | 2 refills | Status: DC

## 2019-11-15 ENCOUNTER — Other Ambulatory Visit: Payer: Self-pay

## 2019-11-15 MED ORDER — METFORMIN HCL 1000 MG PO TABS: 1000.0000 mg | ORAL_TABLET | Freq: Two times a day (BID) | ORAL | 2 refills | Status: DC

## 2020-02-27 ENCOUNTER — Other Ambulatory Visit: Payer: Self-pay

## 2020-02-27 NOTE — Telephone Encounter (Signed)
Needs appointment, then will fill rx. Kc

## 2020-03-04 ENCOUNTER — Other Ambulatory Visit: Payer: Self-pay

## 2020-03-04 MED ORDER — TRIAMTERENE-HCTZ 37.5-25 MG PO TABS
1.0000 | ORAL_TABLET | Freq: Every day | ORAL | 0 refills | Status: DC
Start: 2020-03-04 — End: 2020-03-20

## 2020-03-20 ENCOUNTER — Other Ambulatory Visit: Payer: Self-pay

## 2020-03-20 ENCOUNTER — Ambulatory Visit: Payer: Commercial Managed Care - PPO | Admitting: Nurse Practitioner

## 2020-03-20 ENCOUNTER — Encounter: Payer: Self-pay | Admitting: Physician Assistant

## 2020-03-20 ENCOUNTER — Other Ambulatory Visit: Payer: Self-pay | Admitting: Physician Assistant

## 2020-03-20 ENCOUNTER — Ambulatory Visit (INDEPENDENT_AMBULATORY_CARE_PROVIDER_SITE_OTHER): Payer: Commercial Managed Care - PPO | Admitting: Physician Assistant

## 2020-03-20 VITALS — BP 122/72 | HR 76 | Temp 95.2°F | Ht 71.0 in | Wt 199.0 lb

## 2020-03-20 DIAGNOSIS — I10 Essential (primary) hypertension: Secondary | ICD-10-CM

## 2020-03-20 DIAGNOSIS — E119 Type 2 diabetes mellitus without complications: Secondary | ICD-10-CM | POA: Diagnosis not present

## 2020-03-20 DIAGNOSIS — R634 Abnormal weight loss: Secondary | ICD-10-CM | POA: Insufficient documentation

## 2020-03-20 DIAGNOSIS — E1169 Type 2 diabetes mellitus with other specified complication: Secondary | ICD-10-CM

## 2020-03-20 DIAGNOSIS — E785 Hyperlipidemia, unspecified: Secondary | ICD-10-CM | POA: Insufficient documentation

## 2020-03-20 LAB — POCT URINALYSIS DIPSTICK
Bilirubin, UA: NEGATIVE
Blood, UA: NEGATIVE
Glucose, UA: NEGATIVE
Ketones, UA: NEGATIVE
Leukocytes, UA: NEGATIVE
Nitrite, UA: NEGATIVE
Protein, UA: NEGATIVE
Spec Grav, UA: 1.01 (ref 1.010–1.025)
Urobilinogen, UA: NEGATIVE E.U./dL — AB
pH, UA: 6 (ref 5.0–8.0)

## 2020-03-20 LAB — POCT UA - MICROALBUMIN: Microalbumin Ur, POC: NEGATIVE mg/L

## 2020-03-20 MED ORDER — METFORMIN HCL 1000 MG PO TABS: 1000.0000 mg | ORAL_TABLET | Freq: Two times a day (BID) | ORAL | 1 refills | Status: DC

## 2020-03-20 MED ORDER — TRIAMTERENE-HCTZ 37.5-25 MG PO TABS: 1.0000 | ORAL_TABLET | Freq: Every day | ORAL | 1 refills | Status: DC

## 2020-03-20 MED ORDER — SIMVASTATIN 40 MG PO TABS: 40.0000 mg | ORAL_TABLET | Freq: Every day | ORAL | 1 refills | Status: DC

## 2020-03-20 NOTE — Assessment & Plan Note (Signed)
Well controlled.  No changes to medicines.  Continue to work on eating a healthy diet and exercise.  Labs drawn today.  Pt to let us know which glucometer covered and recommend checking glucose few times weekly

## 2020-03-20 NOTE — Progress Notes (Signed)
Established Patient Office Visit  Subjective:  Patient ID: Shaun Henry, male    DOB: 1945-10-04  Age: 74 y.o. MRN: [id number]  CC:  Chief Complaint  Patient presents with   Diabetes    Medication follow up, patient gets lab work done at his work.    HPI Shaun Henry presents for chronic follow up   Pt presents with hyperlipidemia.  Current treatment includes Zoco 40mg  qd - was diagnosed about 4 years ago  Compliance with treatment has been good; he takes his medication as directed, maintains his low cholesterol diet, follows up as directed, and maintains his exercise regimen.  He denies experiencing any hypercholesterolemia related symptoms.Concurrent health problems include hypertension.      Pt presents for follow up of hypertension.  Date of diagnosis 07/2009.  His current cardiac medication regimen includes Triamterene/HCTZ 37.5/25 mg QD.  [name]  He is tolerating the medication well without side effects.  Compliance with treatment has been good; he takes his medication as directed, maintains his diet and exercise regimen, and follows up as directed.      In regard to the type 2 diabetes mellitus with other specified complication, specifically, this is type 2, non-insulin requiring diabetes, complicated by hypertension and hyperlipidemia. 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   Type 2 diabetes mellitus with other specified complication ([facility])     Past Surgical History:  Procedure Laterality Date   3rd right finger surgery      Family History  Problem Relation Age of Onset   Coronary artery disease Father     Social History   Socioeconomic History   Marital status: Married    Spouse name: Not on file   Number of children: 2   Years of education: Not on file   Highest education level: Not on file  Occupational History   Occupation: Truck Geophysicist/field seismologist  Tobacco Use   Smoking status: Current Some Day Smoker    Years: 10.00    Types: Cigars   Smokeless tobacco: Never Used   Tobacco comment: occassional smoker Cigars.  Average 5 per week  Vaping Use   Vaping Use: Never used  Substance and Sexual Activity   Alcohol use: No   Drug use: No   Sexual activity: Not on file  Other Topics Concern   Not on file  Social History Narrative   Not on file   Social Determinants of Health   Financial Resource Strain:    Difficulty of Paying Living Expenses: Not on file  Food Insecurity:    Worried About [place] in the Last Year: Not on file   Ran Out of Food in the Last Year: Not on file  Transportation Needs:    Lack of Transportation (Medical): Not on file   Lack  of Transportation (Non-Medical): Not on file  Physical Activity:    Days of Exercise per Week: Not on file   Minutes of Exercise per Session: Not on file  Stress:    Feeling of Stress : Not on file  Social Connections:    Frequency of Communication with Friends and Family: Not on file   Frequency of Social Gatherings with Friends and Family: Not on file   Attends Religious Services: Not on file   Active Member of Clubs or Organizations: Not on file   Attends Archivist Meetings: Not on file   Marital Status: Not on file  Intimate Partner Violence:    Fear of Current or Ex-Partner: Not on file   Emotionally Abused: Not on  file   Physically Abused: Not on file   Sexually Abused: Not on file     Current Outpatient Medications:    aspirin EC 81 MG tablet, Take 81 mg by mouth daily., Disp: , Rfl:    metFORMIN (GLUCOPHAGE) 1000 MG tablet, Take 1 tablet (1,000 mg total) by mouth 2 (two) times daily., Disp: 180 tablet, Rfl: 1   simvastatin (ZOCOR) 40 MG tablet, Take 1 tablet (40 mg total) by mouth daily., Disp: 90 tablet, Rfl: 1   triamterene-hydrochlorothiazide (MAXZIDE-25) 37.5-25 MG tablet, Take 1 tablet by mouth daily., Disp: 90 tablet, Rfl: 1   No Known Allergies  ROS CONSTITUTIONAL: see HPI E/N/T: Negative for ear pain, nasal congestion and sore throat.  CARDIOVASCULAR: Negative for chest pain, dizziness, palpitations and pedal edema.  RESPIRATORY: Negative for recent cough and dyspnea.  GASTROINTESTINAL: Negative for abdominal pain, acid reflux symptoms, constipation, diarrhea, nausea and vomiting.  MSK: Negative for arthralgias and myalgias.  INTEGUMENTARY: Negative for rash.  NEUROLOGICAL: Negative for dizziness and headaches.  PSYCHIATRIC: Negative for sleep disturbance and to question depression screen.  Negative for depression, negative for anhedonia.        Objective:    PHYSICAL EXAM:   VS: BP 122/72 (BP Location: Left Arm, Patient Position: Sitting)    Pulse 76    Temp (!) 95.2 F (35.1 C) (Temporal)    Ht 5\' 11"  (1.803 m)    Wt 199 lb (90.3 kg)    SpO2 99%    BMI 27.75 kg/m   GEN: Well nourished, well developed, in no acute distress  HEENT: normal external ears and nose -- Lips, Teeth and Gums - normal  Oropharynx - normal mucosa, palate, and posterior pharynx Cardiac: RRR; no murmurs, rubs, or gallops,no edema -  Respiratory:  normal respiratory rate and pattern with no distress - normal breath sounds with no rales, rhonchi, wheezes or rubs MS: no deformity or atrophy  Skin: warm and dry, no rash  Neuro:  Alert and Oriented x 3, Strength and sensation are intact - CN II-Xii  grossly intact Psych: euthymic mood, appropriate affect and demeanor  BP 122/72 (BP Location: Left Arm, Patient Position: Sitting)    Pulse 76    Temp (!) 95.2 F (35.1 C) (Temporal)    Ht 5\' 11"  (1.803 m)    Wt 199 lb (90.3 kg)    SpO2 99%    BMI 27.75 kg/m  Wt Readings from Last 3 Encounters:  03/20/20 199 lb (90.3 kg)  09/18/19 217 lb (98.4 kg)  05/17/16 253 lb 6.4 oz (114.9 kg)   Office Visit on 03/20/2020  Component Date Value Ref Range Status   Glucose, UA 03/20/2020 Negative  Negative Final   Bilirubin, UA 03/20/2020 Negative  Final   Ketones, UA 03/20/2020 Negative   Final   Spec Grav, UA 03/20/2020 1.010  1.010 - 1.025 Final   Blood, UA 03/20/2020 Negative   Final   pH, UA 03/20/2020 6.0  5.0 - 8.0 Final   Protein, UA 03/20/2020 Negative  Negative Final   Urobilinogen, UA 03/20/2020 negative* 0.2 or 1.0 E.U./dL Final   Nitrite, UA 03/20/2020 Negative   Final   Leukocytes, UA 03/20/2020 Negative  Negative Final   Microalbumin Ur, POC 03/20/2020 Negative  mg/L Final     Health Maintenance Due  Topic Date Due   HEMOGLOBIN A1C  Never done   FOOT EXAM  Never done   TETANUS/TDAP  Never done    There are no preventive care reminders to display for this patient.  Lab Results  Component Value Date   TSH 0.662 05/17/2016   Lab Results  Component Value Date   WBC 4.9 02/01/2014   HGB 14.2 02/01/2014   HCT 43.8 02/01/2014   MCV 83.4 02/01/2014   PLT 198 02/01/2014   Lab Results  Component Value Date   NA 140 02/01/2014   K 4.1 02/01/2014   CHLORIDE 104 02/01/2014   CO2 27 02/01/2014   GLUCOSE 95 02/01/2014   BUN 16.6 02/01/2014   CREATININE 1.2 02/01/2014   CALCIUM 10.0 02/01/2014   ANIONGAP 9 02/01/2014   No results found for: CHOL No results found for: HDL No results found for: LDLCALC No results found for: TRIG No results found for: CHOLHDL No results found for: HGBA1C    Assessment & Plan:   Problem List Items Addressed This Visit       Cardiovascular and Mediastinum   Essential hypertension    Well controlled.  No changes to medicines.  Continue to work on eating a healthy diet and exercise.  Labs drawn today.        Relevant Medications   triamterene-hydrochlorothiazide (MAXZIDE-25) 37.5-25 MG tablet   simvastatin (ZOCOR) 40 MG tablet     Endocrine   Diabetes mellitus without complication ([place])    Well controlled.  No changes to medicines.  Continue to work on eating a healthy diet and exercise.  Labs drawn today.  Pt to let us know which glucometer covered and recommend checking glucose few times weekly      Relevant Medications   metFORMIN (GLUCOPHAGE) 1000 MG tablet   simvastatin (ZOCOR) 40 MG tablet   Other Relevant Orders   POCT urinalysis dipstick (Completed)   POCT UA - Microalbumin (Completed)   Hyperlipidemia associated with type 2 diabetes mellitus ([place]) - Primary    Well controlled.  No changes to medicines.  Continue to work on eating a healthy diet and exercise.  Labs drawn today.        Relevant Medications   metFORMIN (GLUCOPHAGE) 1000 MG tablet   simvastatin (ZOCOR) 40 MG tablet     Other   Weight loss    Recommend weight check in one month         Meds ordered this encounter  Medications   metFORMIN (GLUCOPHAGE) 1000 MG tablet    Sig: Take 1 tablet (1,000 mg total) by mouth 2 (two) times daily.    Dispense:  180 tablet    Refill:  1    Order Specific Question:   Supervising Provider    Answer:   Shelton Silvas   triamterene-hydrochlorothiazide (KGURKYH-06) 37.5-25 MG tablet    Sig: Take 1 tablet by mouth daily.    Dispense:  90 tablet    Refill:  1    Order Specific Question:   Supervising Provider    Answer:   Shelton Silvas   simvastatin (ZOCOR) 40 MG tablet    Sig: Take 1 tablet (40 mg total) by mouth daily.    Dispense:  90 tablet    Refill:  1    Order Specific Question:   Supervising Provider    AnswerShelton Silvas     Follow-up: No follow-ups on file.    SARA R Kiyaan Haq, PA-C

## 2020-03-20 NOTE — Assessment & Plan Note (Signed)
Well controlled.  ?No changes to medicines.  ?Continue to work on eating a healthy diet and exercise.  ?Labs drawn today.  ?

## 2020-03-20 NOTE — Assessment & Plan Note (Signed)
Recommend weight check in one month

## 2020-03-31 ENCOUNTER — Other Ambulatory Visit: Payer: Self-pay | Admitting: Physician Assistant

## 2020-03-31 MED ORDER — VITAMIN D (ERGOCALCIFEROL) 1.25 MG (50000 UNIT) PO CAPS
50000.0000 [IU] | ORAL_CAPSULE | ORAL | 5 refills | Status: DC
Start: 2020-03-31 — End: 2020-09-17

## 2020-04-17 ENCOUNTER — Ambulatory Visit: Payer: Commercial Managed Care - PPO

## 2020-05-13 DIAGNOSIS — Z23 Encounter for immunization: Secondary | ICD-10-CM | POA: Diagnosis not present

## 2020-07-23 ENCOUNTER — Encounter: Payer: Self-pay | Admitting: Physician Assistant

## 2020-08-27 ENCOUNTER — Other Ambulatory Visit: Payer: Self-pay | Admitting: Physician Assistant

## 2020-08-27 DIAGNOSIS — E119 Type 2 diabetes mellitus without complications: Secondary | ICD-10-CM

## 2020-09-17 ENCOUNTER — Encounter: Payer: Self-pay | Admitting: Physician Assistant

## 2020-09-17 ENCOUNTER — Other Ambulatory Visit: Payer: Self-pay

## 2020-09-17 ENCOUNTER — Ambulatory Visit (INDEPENDENT_AMBULATORY_CARE_PROVIDER_SITE_OTHER): Payer: Managed Care, Other (non HMO) | Admitting: Physician Assistant

## 2020-09-17 VITALS — BP 130/80 | HR 75 | Temp 96.8°F | Ht 71.0 in | Wt 201.6 lb

## 2020-09-17 DIAGNOSIS — E119 Type 2 diabetes mellitus without complications: Secondary | ICD-10-CM

## 2020-09-17 DIAGNOSIS — E559 Vitamin D deficiency, unspecified: Secondary | ICD-10-CM

## 2020-09-17 DIAGNOSIS — I10 Essential (primary) hypertension: Secondary | ICD-10-CM | POA: Diagnosis not present

## 2020-09-17 DIAGNOSIS — E1169 Type 2 diabetes mellitus with other specified complication: Secondary | ICD-10-CM | POA: Diagnosis not present

## 2020-09-17 DIAGNOSIS — E785 Hyperlipidemia, unspecified: Secondary | ICD-10-CM | POA: Diagnosis not present

## 2020-09-17 MED ORDER — METFORMIN HCL 1000 MG PO TABS: 1000.0000 mg | ORAL_TABLET | Freq: Every day | ORAL | 2 refills | Status: DC

## 2020-09-17 MED ORDER — SIMVASTATIN 40 MG PO TABS: 40.0000 mg | ORAL_TABLET | Freq: Every day | ORAL | 5 refills | Status: DC

## 2020-09-17 MED ORDER — TRIAMTERENE-HCTZ 37.5-25 MG PO TABS
1.0000 | ORAL_TABLET | Freq: Every day | ORAL | 5 refills | Status: DC
Start: 2020-09-17 — End: 2020-10-29

## 2020-09-17 MED ORDER — VITAMIN D (ERGOCALCIFEROL) 1.25 MG (50000 UNIT) PO CAPS: 50000.0000 [IU] | ORAL_CAPSULE | ORAL | 5 refills | Status: DC

## 2020-09-17 NOTE — Progress Notes (Signed)
Established Patient Office Visit  Subjective:  Patient ID: Shaun Henry, male    DOB: [birth year]  Age: 75 y.o. MRN: [id number]  CC:  Chief Complaint  Patient presents with  . Diabetes    HPI Shaun Henry presents for chronic follow up   Pt presents with hyperlipidemia.  Current treatment includes Zocor 40mg  qd - was diagnosed about 4 years ago  Compliance with treatment has been good; he takes his medication as directed, maintains his low cholesterol diet, follows up as directed, and maintains his exercise regimen.  He denies experiencing any hypercholesterolemia related symptoms.Concurrent health problems include hypertension.      Pt presents for follow up of hypertension.  Date of diagnosis 07/2009.  His current cardiac medication regimen includes Triamterene/HCTZ 37.5/25 mg QD.  [name]  He is tolerating the medication well without side effects.  Compliance with treatment has been good; he takes his medication as directed, maintains his diet and exercise regimen, and follows up as directed.      In regard to the type 2 diabetes mellitus with other specified complication, specifically, this is type 2, non-insulin requiring diabetes, complicated by hypertension and hyperlipidemia. Pt states he has checked his sugars some but cannot recall the values   Pt with history of vit D deficiency - pt took medication for one month but stopped after that  Pt defers colonoscopy and cologuard recommendations  Past Medical History:  Diagnosis Date  . Hypertension   . Mixed hyperlipidemia   . S/P radiation therapy 02/14/2014-03/28/2014   glottis/65.25 Gy 29 fx  . 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 Type 2 diabetes mellitus with other specified complication [facility])     Past Surgical History:  Procedure Laterality Date  . 3rd right finger surgery      Family History  Problem Relation Age of Onset  . Coronary artery disease Father     Social History    Socioeconomic History  . Marital status: Married    Spouse name: Not on file  . Number of children: 2  . Years of education: Not on file  . Highest education level: Not on file  Occupational History  . Occupation: Truck Geophysicist/field seismologist  Tobacco Use  . Smoking status: Current Some Day Smoker    Years: 10.00    Types: Cigars  . Smokeless tobacco: Never Used  . Tobacco comment: occassional smoker Cigars.  Average 5 per week  Vaping Use  . Vaping Use: Never used  Substance and Sexual Activity  . Alcohol use: No  . Drug use: No  . Sexual activity: Not on file  Other Topics Concern  . Not on file  Social History Narrative  . Not on file   Social Determinants of Health   Financial Resource Strain: Not on file  Food Insecurity: Not on file  Transportation Needs: Not on file  Physical Activity: Not on file  Stress: Not on file  Social Connections: Not on file  Intimate Partner Violence: Not on file     Current Outpatient Medications:  .  aspirin EC 81 MG tablet, Take 81 mg by mouth daily., Disp: , Rfl:  .  metFORMIN (GLUCOPHAGE) 1000 MG tablet, Take 1 tablet (1,000 mg total) by mouth daily with breakfast., Disp: 60 tablet, Rfl: 2 .  simvastatin (ZOCOR) 40 MG tablet, Take 1 tablet (40 mg total) by mouth daily., Disp: 30 tablet, Rfl: 5 .  triamterene-hydrochlorothiazide (MAXZIDE-25) 37.5-25 MG tablet, Take 1  tablet by mouth daily., Disp: 30 tablet, Rfl: 5 .  Vitamin D, Ergocalciferol, (DRISDOL) 1.25 MG (50000 UNIT) CAPS capsule, Take 1 capsule (50,000 Units total) by mouth every 7 (seven) days., Disp: 5 capsule, Rfl: 5   No Known Allergies  ROS CONSTITUTIONAL: Negative for chills, fatigue, fever, unintentional weight gain and unintentional weight loss.   CARDIOVASCULAR: Negative for chest pain, dizziness, palpitations and pedal edema.  RESPIRATORY: Negative for recent cough and dyspnea.  GASTROINTESTINAL: Negative for abdominal pain, acid reflux symptoms, constipation, diarrhea,  nausea and vomiting.  MSK: Negative for arthralgias and myalgias.  INTEGUMENTARY: Negative for rash.  PSYCHIATRIC: Negative for sleep disturbance and to question depression screen.  Negative for depression, negative for anhedonia.         Objective:    PHYSICAL EXAM:   VS: BP 130/80 (BP Location: Right Arm, Patient Position: Sitting, Cuff Size: Normal)   Pulse 75   Temp (!) 96.8 F (36 C) (Temporal)   Ht 5\' 11"  (1.803 m)   Wt 201 lb 9.6 oz (91.4 kg)   SpO2 98%   BMI 28.12 kg/m   PHYSICAL EXAM:   VS: BP 130/80 (BP Location: Right Arm, Patient Position: Sitting, Cuff Size: Normal)   Pulse 75   Temp (!) 96.8 F (36 C) (Temporal)   Ht 5\' 11"  (1.803 m)   Wt 201 lb 9.6 oz (91.4 kg)   SpO2 98%   BMI 28.12 kg/m   GEN: Well nourished, well developed, in no acute distress  Cardiac: RRR; no murmurs, rubs, or gallops,no edema -  Respiratory:  normal respiratory rate and pattern with no distress - normal breath sounds with no rales, rhonchi, wheezes or rubs Skin: warm and dry, no rash  Neuro:  Alert and Oriented x 3, Strength and sensation are intact - CN II-Xii grossly intact Psych: euthymic mood, appropriate affect and demeanor   BP 130/80 (BP Location: Right Arm, Patient Position: Sitting, Cuff Size: Normal)   Pulse 75   Temp (!) 96.8 F (36 C) (Temporal)   Ht 5\' 11"  (1.803 m)   Wt 201 lb 9.6 oz (91.4 kg)   SpO2 98%   BMI 28.12 kg/m  Wt Readings from Last 3 Encounters:  09/17/20 201 lb 9.6 oz (91.4 kg)  03/20/20 199 lb (90.3 kg)  09/18/19 217 lb (98.4 kg)   No visits with results within 1 Day(s) from this visit.  Latest known visit with results is:  Documentation on 07/23/2020  Component Date Value Ref Range Status  . Cologuard 11/02/2016 Negative  Negative Final     Health Maintenance Due  Topic Date Due  . HEMOGLOBIN A1C  Never done  . TETANUS/TDAP  Never done  . COVID-19 Vaccine (3 - Moderna risk 4-dose series) 11/08/2019    There are no preventive care  reminders to display for this patient.  Lab Results  Component Value Date   TSH 0.662 05/17/2016   Lab Results  Component Value Date   WBC 4.9 02/01/2014   HGB 14.2 02/01/2014   HCT 43.8 02/01/2014   MCV 83.4 02/01/2014   PLT 198 02/01/2014   Lab Results  Component Value Date   NA 140 02/01/2014   K 4.1 02/01/2014   CHLORIDE 104 02/01/2014   CO2 27 02/01/2014   GLUCOSE 95 02/01/2014   BUN 16.6 02/01/2014   CREATININE 1.2 02/01/2014   CALCIUM 10.0 02/01/2014   ANIONGAP 9 02/01/2014   No results found for: CHOL No results found for: HDL No results  found for: LDLCALC No results found for: TRIG No results found for: CHOLHDL No results found for: HGBA1C    Assessment & Plan:   Problem List Items Addressed This Visit      Cardiovascular and Mediastinum   Essential hypertension - Primary   Relevant Medications   simvastatin (ZOCOR) 40 MG tablet   triamterene-hydrochlorothiazide (MAXZIDE-25) 37.5-25 MG tablet   Other Relevant Orders   CBC with Differential/Platelet   Comprehensive metabolic panel     Endocrine   Diabetes mellitus without complication (HCC)   Relevant Medications   metFORMIN (GLUCOPHAGE) 1000 MG tablet   simvastatin (ZOCOR) 40 MG tablet   Other Relevant Orders   CBC with Differential/Platelet   Comprehensive metabolic panel   Hemoglobin A1c   Hyperlipidemia associated with type 2 diabetes mellitus (HCC)   Relevant Medications   metFORMIN (GLUCOPHAGE) 1000 MG tablet   simvastatin (ZOCOR) 40 MG tablet   Other Relevant Orders   Lipid panel     Other   Vitamin D insufficiency   Relevant Medications   Vitamin D, Ergocalciferol, (DRISDOL) 1.25 MG (50000 UNIT) CAPS capsule   Other Relevant Orders   VITAMIN D 25 Hydroxy (Vit-D Deficiency, Fractures)      Meds ordered this encounter  Medications  . metFORMIN (GLUCOPHAGE) 1000 MG tablet    Sig: Take 1 tablet (1,000 mg total) by mouth daily with breakfast.    Dispense:  60 tablet    Refill:  2     Order Specific Question:   Supervising Provider    AnswerShelton Silvas  . simvastatin (ZOCOR) 40 MG tablet    Sig: Take 1 tablet (40 mg total) by mouth daily.    Dispense:  30 tablet    Refill:  5    Order Specific Question:   Supervising Provider    AnswerRochel Brome [id number]  . triamterene-hydrochlorothiazide (MAXZIDE-25) 37.5-25 MG tablet    Sig: Take 1 tablet by mouth daily.    Dispense:  30 tablet    Refill:  5    Order Specific Question:   Supervising Provider    AnswerRochel Brome [id number]  . Vitamin D, Ergocalciferol, (DRISDOL) 1.25 MG (50000 UNIT) CAPS capsule    Sig: Take 1 capsule (50,000 Units total) by mouth every 7 (seven) days.    Dispense:  5 capsule    Refill:  5    Order Specific Question:   Supervising Provider    Answer:   Shelton Silvas    Follow-up: Return in about 6 months (around 03/20/2021) for chronic fasting follow up.    SARA R Loyed Wilmes, PA-C

## 2020-09-18 ENCOUNTER — Other Ambulatory Visit: Payer: Self-pay | Admitting: Physician Assistant

## 2020-09-18 DIAGNOSIS — E559 Vitamin D deficiency, unspecified: Secondary | ICD-10-CM

## 2020-09-18 LAB — HEMOGLOBIN A1C
Est. average glucose Bld gHb Est-mCnc: 117 mg/dL
Hgb A1c MFr Bld: 5.7 % — ABNORMAL HIGH (ref 4.8–5.6)

## 2020-09-18 LAB — COMPREHENSIVE METABOLIC PANEL
ALT: 15 IU/L (ref 0–44)
AST: 10 IU/L (ref 0–40)
Albumin/Globulin Ratio: 1.9 (ref 1.2–2.2)
Albumin: 4.5 g/dL (ref 3.7–4.7)
Alkaline Phosphatase: 102 IU/L (ref 44–121)
BUN/Creatinine Ratio: 12 (ref 10–24)
BUN: 11 mg/dL (ref 8–27)
Bilirubin Total: 0.3 mg/dL (ref 0.0–1.2)
CO2: 25 mmol/L (ref 20–29)
Calcium: 10 mg/dL (ref 8.6–10.2)
Chloride: 103 mmol/L (ref 96–106)
Creatinine, Ser: 0.94 mg/dL (ref 0.76–1.27)
Globulin, Total: 2.4 g/dL (ref 1.5–4.5)
Glucose: 134 mg/dL — ABNORMAL HIGH (ref 65–99)
Potassium: 4 mmol/L (ref 3.5–5.2)
Sodium: 141 mmol/L (ref 134–144)
Total Protein: 6.9 g/dL (ref 6.0–8.5)
eGFR: 86 mL/min/{1.73_m2} (ref >59)

## 2020-09-18 LAB — CBC WITH DIFFERENTIAL/PLATELET
Basophils Absolute: 0 10*3/uL (ref 0.0–0.2)
Basos: 1 %
EOS (ABSOLUTE): 0.1 10*3/uL (ref 0.0–0.4)
Eos: 1 %
Hematocrit: 40.9 % (ref 37.5–51.0)
Hemoglobin: 13.4 g/dL (ref 13.0–17.7)
Immature Grans (Abs): 0 10*3/uL (ref 0.0–0.1)
Immature Granulocytes: 0 %
Lymphocytes Absolute: 1.8 10*3/uL (ref 0.7–3.1)
Lymphs: 38 %
MCH: 27.5 pg (ref 26.6–33.0)
MCHC: 32.8 g/dL (ref 31.5–35.7)
MCV: 84 fL (ref 79–97)
Monocytes Absolute: 0.3 10*3/uL (ref 0.1–0.9)
Monocytes: 6 %
Neutrophils Absolute: 2.5 10*3/uL (ref 1.4–7.0)
Neutrophils: 54 %
Platelets: 192 10*3/uL (ref 150–450)
RBC: 4.88 x10E6/uL (ref 4.14–5.80)
RDW: 13.2 % (ref 11.6–15.4)
WBC: 4.7 10*3/uL (ref 3.4–10.8)

## 2020-09-18 LAB — LIPID PANEL
Chol/HDL Ratio: 4.3 ratio (ref 0.0–5.0)
Cholesterol, Total: 162 mg/dL (ref 100–199)
HDL: 38 mg/dL — ABNORMAL LOW (ref >39)
LDL Chol Calc (NIH): 100 mg/dL — ABNORMAL HIGH (ref 0–99)
Triglycerides: 135 mg/dL (ref 0–149)
VLDL Cholesterol Cal: 24 mg/dL (ref 5–40)

## 2020-09-18 LAB — VITAMIN D 25 HYDROXY (VIT D DEFICIENCY, FRACTURES): Vit D, 25-Hydroxy: 18.2 ng/mL — ABNORMAL LOW (ref 30.0–100.0)

## 2020-09-18 LAB — CARDIOVASCULAR RISK ASSESSMENT

## 2020-09-18 MED ORDER — VITAMIN D (ERGOCALCIFEROL) 1.25 MG (50000 UNIT) PO CAPS: 50000.0000 [IU] | ORAL_CAPSULE | ORAL | 5 refills | Status: DC

## 2020-10-29 ENCOUNTER — Other Ambulatory Visit: Payer: Self-pay

## 2020-10-29 DIAGNOSIS — I10 Essential (primary) hypertension: Secondary | ICD-10-CM

## 2020-10-29 MED ORDER — TRIAMTERENE-HCTZ 37.5-25 MG PO TABS: 1.0000 | ORAL_TABLET | Freq: Every day | ORAL | 5 refills | Status: DC

## 2021-03-25 ENCOUNTER — Encounter: Payer: Self-pay | Admitting: Physician Assistant

## 2021-03-25 ENCOUNTER — Ambulatory Visit (INDEPENDENT_AMBULATORY_CARE_PROVIDER_SITE_OTHER): Payer: Medicare Other | Admitting: Physician Assistant

## 2021-03-25 ENCOUNTER — Other Ambulatory Visit: Payer: Self-pay

## 2021-03-25 VITALS — BP 132/80 | HR 96 | Temp 96.6°F | Ht 71.0 in | Wt 198.6 lb

## 2021-03-25 DIAGNOSIS — E1169 Type 2 diabetes mellitus with other specified complication: Secondary | ICD-10-CM | POA: Diagnosis not present

## 2021-03-25 DIAGNOSIS — E785 Hyperlipidemia, unspecified: Secondary | ICD-10-CM | POA: Diagnosis not present

## 2021-03-25 DIAGNOSIS — I10 Essential (primary) hypertension: Secondary | ICD-10-CM | POA: Diagnosis not present

## 2021-03-25 DIAGNOSIS — E559 Vitamin D deficiency, unspecified: Secondary | ICD-10-CM | POA: Diagnosis not present

## 2021-03-25 DIAGNOSIS — E119 Type 2 diabetes mellitus without complications: Secondary | ICD-10-CM

## 2021-03-25 MED ORDER — SIMVASTATIN 40 MG PO TABS
40.0000 mg | ORAL_TABLET | Freq: Every day | ORAL | 5 refills | Status: DC
Start: 2021-03-25 — End: 2021-09-22

## 2021-03-25 NOTE — Progress Notes (Signed)
Established Patient Office Visit  Subjective:  Patient ID: Shaun Henry, male    DOB: 05/04/1946  Age: 75 y.o. MRN: [id number]  CC:  Chief Complaint  Patient presents with   Hypertension   Diabetes    HPI Shaun Henry presents for chronic follow up   Pt presents with hyperlipidemia.  Current treatment includes Zocor 76m qd - was diagnosed about 4 years ago  Compliance with treatment has been good; he takes his medication as directed, maintains his low cholesterol diet, follows up as directed, and maintains his exercise regimen.  He denies experiencing any hypercholesterolemia related symptoms.Concurrent health problems include hypertension.      Pt presents for follow up of hypertension.  Date of diagnosis 07/2009.  His current cardiac medication regimen includes Triamterene/HCTZ 37.5/25 mg QD.  .[name] He is tolerating the medication well without side effects.  Compliance with treatment has been good; he takes his medication as directed, maintains his diet and exercise regimen, and follows up as directed.      In regard to the type 2 diabetes mellitus with other specified complication, specifically, this is type 2, non-insulin requiring diabetes, complicated by hypertension and hyperlipidemia. 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   Type 2 diabetes mellitus with other specified complication (HCC)     Past Surgical History:  Procedure Laterality Date   3rd right finger surgery       Family History  Problem Relation Age of Onset   Coronary artery disease Father     Social History   Socioeconomic History   Marital status: Married    Spouse name: Not on file   Number of children: 2   Years of education: Not on file   Highest education level: Not on file  Occupational History   Occupation: Truck DGeophysicist/field seismologist Tobacco Use   Smoking status: Some Days    Types: Cigars   Smokeless tobacco: Never   Tobacco comments:    occassional smoker Cigars.  Average 5 per week  Vaping Use   Vaping Use: Never used  Substance and Sexual Activity   Alcohol use: No   Drug use: No   Sexual activity: Not on file  Other Topics Concern   Not on file  Social History Narrative   Not on file   Social Determinants of Health   Financial Resource Strain: Not on file  Food Insecurity: Not on file  Transportation Needs: Not on file  Physical Activity: Not on file  Stress: Not on file  Social Connections: Not on file  Intimate Partner Violence: Not on file     Current Outpatient Medications:    aspirin EC 81 MG tablet, Take 81 mg by mouth daily., Disp: , Rfl:    metFORMIN (GLUCOPHAGE) 1000 MG tablet, Take 1 tablet (1,000 mg total) by mouth daily with breakfast., Disp: 60 tablet, Rfl: 2   triamterene-hydrochlorothiazide (MAXZIDE-25) 37.5-25 MG tablet, Take  1 tablet by mouth daily., Disp: 30 tablet, Rfl: 5   Vitamin D, Ergocalciferol, (DRISDOL) 1.25 MG (50000 UNIT) CAPS capsule, Take 1 capsule (50,000 Units total) by mouth every 7 (seven) days., Disp: 5 capsule, Rfl: 5   simvastatin (ZOCOR) 40 MG tablet, Take 1 tablet (40 mg total) by mouth daily., Disp: 30 tablet, Rfl: 5   No Known Allergies  CONSTITUTIONAL: Negative for chills, fatigue, fever, unintentional weight gain and unintentional weight loss.  CARDIOVASCULAR: Negative for chest pain, dizziness, palpitations and pedal edema.  RESPIRATORY: Negative for recent cough and dyspnea.  GASTROINTESTINAL: Negative for abdominal  pain, acid reflux symptoms, constipation, diarrhea, nausea and vomiting.  MSK: Negative for arthralgias and myalgias.  INTEGUMENTARY: Negative for rash.  PSYCHIATRIC: Negative for sleep disturbance and to question depression screen.  Negative for depression, negative for anhedonia.        Objective:  PHYSICAL EXAM:   VS: BP 132/80 (BP Location: Left Arm, Patient Position: Sitting, Cuff Size: Large)   Pulse 96   Temp (!) 96.6 F (35.9 C) (Temporal)   Ht _0  (1.803 m)   Wt 198 lb 9.6 oz (90.1 kg)   SpO2 98%   BMI 27.70 kg/m   GEN: Well nourished, well developed, in no acute distress  Cardiac: RRR; no murmurs, rubs, or gallops,no edema - Respiratory:  normal respiratory rate and pattern with no distress - normal breath sounds with no rales, rhonchi, wheezes or rubs MS: no deformity or atrophy  Skin: warm and dry, no rash  Psych: euthymic mood, appropriate affect and demeanor   No visits with results within 1 Day(s) from this visit.  Latest known visit with results is:  Office Visit on 09/17/2020  Component Date Value Ref Range Status   WBC 09/17/2020 4.7  3.4 - 10.8 x10E3/uL Final   RBC 09/17/2020 4.88  4.14 - 5.80 x10E6/uL Final   Hemoglobin 09/17/2020 13.4  13.0 - 17.7 g/dL Final   Hematocrit 09/17/2020 40.9  37.5 - 51.0 % Final   MCV 09/17/2020 84  79 - 97 fL Final   MCH 09/17/2020 27.5  26.6 - 33.0 pg Final   MCHC 09/17/2020 32.8  31.5 - 35.7 g/dL Final   RDW 09/17/2020 13.2  11.6 - 15.4 % Final   Platelets 09/17/2020 192  150 - 450 x10E3/uL Final   Neutrophils 09/17/2020 54  Not Estab. % Final   Lymphs 09/17/2020 38  Not Estab. % Final   Monocytes 09/17/2020 6  Not Estab. % Final   Eos 09/17/2020 1  Not Estab. % Final   Basos 09/17/2020 1  Not Estab. % Final   Neutrophils Absolute 09/17/2020 2.5  1.4 - 7.0 x10E3/uL Final   Lymphocytes Absolute 09/17/2020 1.8  0.7 - 3.1 x10E3/uL Final   Monocytes Absolute 09/17/2020 0.3  0.1 - 0.9 x10E3/uL Final   EOS (ABSOLUTE)  09/17/2020 0.1  0.0 - 0.4 x10E3/uL Final   Basophils Absolute 09/17/2020 0.0  0.0 - 0.2 x10E3/uL Final   Immature Granulocytes 09/17/2020 0  Not Estab. % Final   Immature Grans (Abs) 09/17/2020 0.0  0.0 - 0.1 x10E3/uL Final   Glucose 09/17/2020 134 (A) 65 - 99 mg/dL Final   BUN 09/17/2020 11  8 - 27 mg/dL Final   Creatinine, Ser 09/17/2020 0.94  0.76 - 1.27 mg/dL Final   eGFR 09/17/2020 86  >59 mL/min/1.73 Final   BUN/Creatinine Ratio 09/17/2020 12  10 - 24 Final   Sodium 09/17/2020 141  134 - 144 mmol/L Final  Potassium 09/17/2020 4.0  3.5 - 5.2 mmol/L Final   Chloride 09/17/2020 103  96 - 106 mmol/L Final   CO2 09/17/2020 25  20 - 29 mmol/L Final   Calcium 09/17/2020 10.0  8.6 - 10.2 mg/dL Final   Total Protein 09/17/2020 6.9  6.0 - 8.5 g/dL Final   Albumin 09/17/2020 4.5  3.7 - 4.7 g/dL Final   Globulin, Total 09/17/2020 2.4  1.5 - 4.5 g/dL Final   Albumin/Globulin Ratio 09/17/2020 1.9  1.2 - 2.2 Final   Bilirubin Total 09/17/2020 0.3  0.0 - 1.2 mg/dL Final   Alkaline Phosphatase 09/17/2020 102  44 - 121 IU/L Final   AST 09/17/2020 10  0 - 40 IU/L Final   ALT 09/17/2020 15  0 - 44 IU/L Final   Cholesterol, Total 09/17/2020 162  100 - 199 mg/dL Final   Triglycerides 09/17/2020 135  0 - 149 mg/dL Final   HDL 09/17/2020 38 (A) >39 mg/dL Final   VLDL Cholesterol Cal 09/17/2020 24  5 - 40 mg/dL Final   LDL Chol Calc (NIH) 09/17/2020 100 (A) 0 - 99 mg/dL Final   Chol/HDL Ratio 09/17/2020 4.3  0.0 - 5.0 ratio Final   Comment:                                   T. Chol/HDL Ratio                                             Men  Women                               1/2 Avg.Risk  3.4    3.3                                   Avg.Risk  5.0    4.4                                2X Avg.Risk  9.6    7.1                                3X Avg.Risk 23.4   11.0    Hgb A1c MFr Bld 09/17/2020 5.7 (A) 4.8 - 5.6 % Final   Comment:          Prediabetes: 5.7 - 6.4          Diabetes: >6.4           Glycemic control for adults with diabetes: <7.0    Est. average glucose Bld gHb Est-m* 09/17/2020 117  mg/dL Final   Vit D, 25-Hydroxy 09/17/2020 18.2 (A) 30.0 - 100.0 ng/mL Final   Comment: Vitamin D deficiency has been defined by the [place] practice guideline as a level of serum 25-OH vitamin D less than 20 ng/mL (1,2). The Endocrine Society went on to further define vitamin D insufficiency as a level between 21 and 29 ng/mL (2). 1. IOM (Institute of Medicine). 2010. Dietary reference    intakes for calcium  and D. [place]: The    [employer]. 2. Holick MF, Binkley Burien, Bischoff-Ferrari HA, et al.    Evaluation, treatment, and prevention of vitamin D    deficiency: an Endocrine Society clinical practice    guideline. JCEM. 2011 Jul; 96(7):1911-30.    Interpretation 09/17/2020 Note   Final   Supplemental report is available.     Health Maintenance Due  Topic Date Due   COVID-19 Vaccine (3 - Moderna risk series) 11/08/2019   OPHTHALMOLOGY EXAM  10/14/2020   HEMOGLOBIN A1C  03/20/2021   URINE MICROALBUMIN  03/20/2021    There are no preventive care reminders to display for this patient.  Lab Results  Component Value Date   TSH 0.662 05/17/2016   Lab Results  Component Value Date   WBC 4.7 09/17/2020   HGB 13.4 09/17/2020   HCT 40.9 09/17/2020   MCV 84 09/17/2020   PLT 192 09/17/2020   Lab Results  Component Value Date   NA 141 09/17/2020   K 4.0 09/17/2020   CHLORIDE 104 02/01/2014   CO2 25 09/17/2020   GLUCOSE 134 (H) 09/17/2020   BUN 11 09/17/2020   CREATININE 0.94 09/17/2020   BILITOT 0.3 09/17/2020   ALKPHOS 102 09/17/2020   AST 10 09/17/2020   ALT 15 09/17/2020   PROT 6.9 09/17/2020   ALBUMIN 4.5 09/17/2020   CALCIUM 10.0 09/17/2020   ANIONGAP 9 02/01/2014   EGFR 86 09/17/2020   Lab Results  Component Value Date   CHOL 162 09/17/2020   Lab Results  Component Value Date   HDL 38 (L)  09/17/2020   Lab Results  Component Value Date   LDLCALC 100 (H) 09/17/2020   Lab Results  Component Value Date   TRIG 135 09/17/2020   Lab Results  Component Value Date   CHOLHDL 4.3 09/17/2020   Lab Results  Component Value Date   HGBA1C 5.7 (H) 09/17/2020      Assessment & Plan:   Problem List Items Addressed This Visit       Cardiovascular and Mediastinum   Essential hypertension   Relevant Medications   simvastatin (ZOCOR) 40 MG tablet Continue meds     Endocrine   Diabetes mellitus without complication (HCC)   Relevant Medications   simvastatin (ZOCOR) 40 MG tablet Continue meds and watch diet   Hyperlipidemia associated with type 2 diabetes mellitus (HCC)   Relevant Medications   simvastatin (ZOCOR) 40 MG tablet Continue meds and watch diet     Other   Vitamin D insufficiency - Primary Return for labwork     Meds ordered this encounter  Medications   simvastatin (ZOCOR) 40 MG tablet    Sig: Take 1 tablet (40 mg total) by mouth daily.    Dispense:  30 tablet    Refill:  5    Order Specific Question:   Supervising Provider    AnswerShelton Silvas     Follow-up: Return in about 6 months (around 09/22/2021) for chronic fasting follow up. But will need to come back within month for fasting labwork (pt having problem with [facility] billing at this time)   [place], PA-C

## 2021-09-22 ENCOUNTER — Encounter: Payer: Self-pay | Admitting: Physician Assistant

## 2021-09-22 ENCOUNTER — Ambulatory Visit (INDEPENDENT_AMBULATORY_CARE_PROVIDER_SITE_OTHER): Payer: Medicare Other | Admitting: Physician Assistant

## 2021-09-22 VITALS — BP 128/72 | HR 98 | Temp 97.7°F | Ht 71.0 in | Wt 214.0 lb

## 2021-09-22 DIAGNOSIS — E1169 Type 2 diabetes mellitus with other specified complication: Secondary | ICD-10-CM

## 2021-09-22 DIAGNOSIS — E559 Vitamin D deficiency, unspecified: Secondary | ICD-10-CM | POA: Diagnosis not present

## 2021-09-22 DIAGNOSIS — Z125 Encounter for screening for malignant neoplasm of prostate: Secondary | ICD-10-CM

## 2021-09-22 DIAGNOSIS — E785 Hyperlipidemia, unspecified: Secondary | ICD-10-CM | POA: Diagnosis not present

## 2021-09-22 DIAGNOSIS — R351 Nocturia: Secondary | ICD-10-CM | POA: Diagnosis not present

## 2021-09-22 DIAGNOSIS — I10 Essential (primary) hypertension: Secondary | ICD-10-CM

## 2021-09-22 DIAGNOSIS — E119 Type 2 diabetes mellitus without complications: Secondary | ICD-10-CM | POA: Diagnosis not present

## 2021-09-22 LAB — POCT URINALYSIS DIP (CLINITEK)
Bilirubin, UA: NEGATIVE
Blood, UA: NEGATIVE
Glucose, UA: NEGATIVE mg/dL
Ketones, POC UA: NEGATIVE mg/dL
Leukocytes, UA: NEGATIVE
Nitrite, UA: NEGATIVE
Spec Grav, UA: 1.015 (ref 1.010–1.025)
Urobilinogen, UA: 0.2 E.U./dL
pH, UA: 6 (ref 5.0–8.0)

## 2021-09-22 MED ORDER — VITAMIN D (ERGOCALCIFEROL) 1.25 MG (50000 UNIT) PO CAPS: 50000.0000 [IU] | ORAL_CAPSULE | ORAL | 5 refills | Status: DC

## 2021-09-22 MED ORDER — TRIAMTERENE-HCTZ 37.5-25 MG PO TABS: 1.0000 | ORAL_TABLET | Freq: Every day | ORAL | 5 refills | Status: DC

## 2021-09-22 MED ORDER — SIMVASTATIN 40 MG PO TABS: 40.0000 mg | ORAL_TABLET | Freq: Every day | ORAL | 5 refills | Status: DC

## 2021-09-22 MED ORDER — METFORMIN HCL 1000 MG PO TABS: 1000.0000 mg | ORAL_TABLET | Freq: Every day | ORAL | 5 refills | Status: DC

## 2021-09-22 NOTE — Progress Notes (Signed)
? ?Established Patient Office Visit ? ?Subjective:  ?Patient ID: Shaun Henry, male    DOB: 06-13-1946  Age: 76 y.o. MRN: [id number] ? ?CC:  ?Chief Complaint  ?Patient presents with  ? Hypertension  ? ?NOTE ---- PT NEVER CAME BACK AT LAST VISIT TO GET HIS 6 MONTH LABWORK DONE ?HPI ?[name] presents for chronic follow up ? ? Pt presents with hyperlipidemia.  Current treatment includes Zocor 73m qd - was diagnosed about 5 years ago  Compliance with treatment has been good; he takes his medication as directed, maintains his low cholesterol diet, follows up as directed, and maintains his exercise regimen.  He denies experiencing any hypercholesterolemia related symptoms.Concurrent health problems include hypertension.  OVERDUE FOR LABWORK ?   ?Pt presents for follow up of hypertension.  Date of diagnosis 07/2009.  His current cardiac medication regimen includes Triamterene/HCTZ 37.5/25 mg QD.  .[name] He is tolerating the medication well without side effects.  Compliance with treatment has been good; he takes his medication as directed, maintains his diet and exercise regimen, and follows up as directed.   ?OVERDUE FOR LABWORK ?   ?In regard to the type 2 diabetes mellitus with other specified complication, specifically, this is type 2, non-insulin requiring diabetes, complicated by hypertension and hyperlipidemia. Pt states he has checked his sugars some but cannot recall the values - he is currently on glucophage 10059mbid ? ?Pt with history of vit D deficiency - pt currently taking weekly supplements ? ? ?Past Medical History:  ?Diagnosis Date  ? Hypertension   ? Mixed hyperlipidemia   ? S/P radiation therapy 02/14/2014-03/28/2014  ? glottis/65.25 Gy 29 fx  ? [place]  ?  Glottic carcinoma  ? Type 2 diabetes mellitus with other specified complication ([place]  ? ? ?Past Surgical History:  ?Procedure Laterality Date  ? 3rd right finger surgery    ? ? ?Family History  ?Problem  Relation Age of Onset  ? Coronary artery disease Father   ? ? ?Social History  ? ?Socioeconomic History  ? Marital status: Married  ?  Spouse name: Not on file  ? Number of children: 2  ? Years of education: Not on file  ? Highest education level: Not on file  ?Occupational History  ? Occupation: TrAdministrator?Tobacco Use  ? Smoking status: Some Days  ?  Types: Cigars  ? Smokeless tobacco: Never  ? Tobacco comments:  ?  occassional smoker Cigars.  Average 5 per week  ?Vaping Use  ? Vaping Use: Never used  ?Substance and Sexual Activity  ? Alcohol use: No  ? Drug use: No  ? Sexual activity: Not on file  ?Other Topics Concern  ? Not on file  ?Social History Narrative  ? Not on file  ? ?Social Determinants of Health  ? ?Financial Resource Strain: Not on file  ?Food Insecurity: Not on file  ?Transportation Needs: Not on file  ?Physical Activity: Not on file  ?Stress: Not on file  ?Social Connections: Not on file  ?Intimate Partner Violence: Not on file  ? ? ? ?Current Outpatient Medications:  ?  aspirin EC 81 MG tablet, Take 81 mg by mouth daily., Disp: , Rfl:  ?  metFORMIN (GLUCOPHAGE) 1000 MG tablet, Take 1 tablet (1,000 mg total) by mouth daily with breakfast., Disp: 60 tablet, Rfl: 5 ?  simvastatin (ZOCOR) 40 MG tablet, Take 1 tablet (40 mg total) by mouth daily., Disp: 30 tablet,  Rfl: 5 ?  triamterene-hydrochlorothiazide (MAXZIDE-25) 37.5-25 MG tablet, Take 1 tablet by mouth daily., Disp: 30 tablet, Rfl: 5 ?  Vitamin D, Ergocalciferol, (DRISDOL) 1.25 MG (50000 UNIT) CAPS capsule, Take 1 capsule (50,000 Units total) by mouth every 7 (seven) days., Disp: 5 capsule, Rfl: 5 ? ? ?No Known Allergies ? ?CONSTITUTIONAL: Negative for chills, fatigue, fever, unintentional weight gain and unintentional weight loss.  ?E/N/T: Negative for ear pain, nasal congestion and sore throat.  ?CARDIOVASCULAR: Negative for chest pain, dizziness, palpitations and pedal edema.  ?RESPIRATORY: Negative for recent cough and dyspnea.   ?GASTROINTESTINAL: Negative for abdominal pain, acid reflux symptoms, constipation, diarrhea, nausea and vomiting.  ?MSK: Negative for arthralgias and myalgias.  ?INTEGUMENTARY: Negative for rash.  ?NEUROLOGICAL: Negative for dizziness and headaches.  ?PSYCHIATRIC: Negative for sleep disturbance and to question depression screen.  Negative for depression, negative for anhedonia.  ?   ? ?  ?Objective:  ? PHYSICAL EXAM:  ? ?VS: BP 128/72   Pulse 98   Temp 97.7 ?F (36.5 ?C)   Ht 5' 11"  (1.803 m)   Wt 214 lb (97.1 kg)   SpO2 98%   BMI 29.85 kg/m?  ? ?GEN: Well nourished, well developed, in no acute distress  ?Cardiac: RRR; no murmurs, rubs, or gallops,no edema - ?Respiratory:  normal respiratory rate and pattern with no distress - normal breath sounds with no rales, rhonchi, wheezes or rubs ? ?MS: no deformity or atrophy  ?Skin: warm and dry, no rash  ? ?Psych: euthymic mood, appropriate affect and demeanor ? ?Office Visit on 09/22/2021  ?Component Date Value Ref Range Status  ? Color, UA 09/22/2021 yellow  yellow Final  ? Clarity, UA 09/22/2021 clear  clear Final  ? Glucose, UA 09/22/2021 negative  negative mg/dL Final  ? Bilirubin, UA 09/22/2021 negative  negative Final  ? Ketones, POC UA 09/22/2021 negative  negative mg/dL Final  ? Spec Grav, UA 09/22/2021 1.015  1.010 - 1.025 Final  ? Blood, UA 09/22/2021 negative  negative Final  ? pH, UA 09/22/2021 6.0  5.0 - 8.0 Final  ? POC PROTEIN,UA 09/22/2021 trace  negative, trace Final  ? Urobilinogen, UA 09/22/2021 0.2  0.2 or 1.0 E.U./dL Final  ? Nitrite, UA 09/22/2021 Negative  Negative Final  ? Leukocytes, UA 09/22/2021 Negative  Negative Final  ? ?Diabetic Foot Exam - Simple   ?Simple Foot Form ?Diabetic Foot exam was performed with the following findings: Yes 09/22/2021  3:54 PM  ?Visual Inspection ?No deformities, no ulcerations, no other skin breakdown bilaterally: Yes ?Sensation Testing ?Intact to touch and monofilament testing bilaterally: Yes ?Pulse  Check ?Posterior Tibialis and Dorsalis pulse intact bilaterally: Yes ?Comments ?  ? ? ?Office Visit on 09/22/2021  ?Component Date Value Ref Range Status  ? Color, UA 09/22/2021 yellow  yellow Final  ? Clarity, UA 09/22/2021 clear  clear Final  ? Glucose, UA 09/22/2021 negative  negative mg/dL Final  ? Bilirubin, UA 09/22/2021 negative  negative Final  ? Ketones, POC UA 09/22/2021 negative  negative mg/dL Final  ? Spec Grav, UA 09/22/2021 1.015  1.010 - 1.025 Final  ? Blood, UA 09/22/2021 negative  negative Final  ? pH, UA 09/22/2021 6.0  5.0 - 8.0 Final  ? POC PROTEIN,UA 09/22/2021 trace  negative, trace Final  ? Urobilinogen, UA 09/22/2021 0.2  0.2 or 1.0 E.U./dL Final  ? Nitrite, UA 09/22/2021 Negative  Negative Final  ? Leukocytes, UA 09/22/2021 Negative  Negative Final  ? ? ? ?Health Maintenance Due  ?  Topic Date Due  ? HEMOGLOBIN A1C  03/20/2021  ? ? ?There are no preventive care reminders to display for this patient. ? ?Lab Results  ?Component Value Date  ? TSH 0.662 05/17/2016  ? ?Lab Results  ?Component Value Date  ? WBC 4.7 09/17/2020  ? HGB 13.4 09/17/2020  ? HCT 40.9 09/17/2020  ? MCV 84 09/17/2020  ? PLT 192 09/17/2020  ? ?Lab Results  ?Component Value Date  ? NA 141 09/17/2020  ? K 4.0 09/17/2020  ? CHLORIDE 104 02/01/2014  ? CO2 25 09/17/2020  ? GLUCOSE 134 (H) 09/17/2020  ? BUN 11 09/17/2020  ? CREATININE 0.94 09/17/2020  ? BILITOT 0.3 09/17/2020  ? ALKPHOS 102 09/17/2020  ? AST 10 09/17/2020  ? ALT 15 09/17/2020  ? PROT 6.9 09/17/2020  ? ALBUMIN 4.5 09/17/2020  ? CALCIUM 10.0 09/17/2020  ? ANIONGAP 9 02/01/2014  ? EGFR 86 09/17/2020  ? ?Lab Results  ?Component Value Date  ? CHOL 162 09/17/2020  ? ?Lab Results  ?Component Value Date  ? HDL 38 (L) 09/17/2020  ? ?Lab Results  ?Component Value Date  ? LDLCALC 100 (H) 09/17/2020  ? ?Lab Results  ?Component Value Date  ? TRIG 135 09/17/2020  ? ?Lab Results  ?Component Value Date  ? CHOLHDL 4.3 09/17/2020  ? ?Lab Results  ?Component Value Date  ? HGBA1C 5.7  (H) 09/17/2020  ? ? ?  ?Assessment & Plan:  ? ?Problem List Items Addressed This Visit   ? ?  ? Cardiovascular and Mediastinum  ? Essential hypertension  ? Relevant Medications  ? simvastatin (ZOCOR) 40 MG tablet  ? triamtere

## 2021-09-23 LAB — CBC WITH DIFFERENTIAL/PLATELET
Basophils Absolute: 0 10*3/uL (ref 0.0–0.2)
Basos: 1 %
EOS (ABSOLUTE): 0.1 10*3/uL (ref 0.0–0.4)
Eos: 1 %
Hematocrit: 44.8 % (ref 37.5–51.0)
Hemoglobin: 15.2 g/dL (ref 13.0–17.7)
Immature Grans (Abs): 0 10*3/uL (ref 0.0–0.1)
Immature Granulocytes: 0 %
Lymphocytes Absolute: 1.7 10*3/uL (ref 0.7–3.1)
Lymphs: 33 %
MCH: 27.6 pg (ref 26.6–33.0)
MCHC: 33.9 g/dL (ref 31.5–35.7)
MCV: 81 fL (ref 79–97)
Monocytes Absolute: 0.5 10*3/uL (ref 0.1–0.9)
Monocytes: 9 %
Neutrophils Absolute: 3 10*3/uL (ref 1.4–7.0)
Neutrophils: 56 %
Platelets: 228 10*3/uL (ref 150–450)
RBC: 5.51 x10E6/uL (ref 4.14–5.80)
RDW: 13.1 % (ref 11.6–15.4)
WBC: 5.3 10*3/uL (ref 3.4–10.8)

## 2021-09-23 LAB — COMPREHENSIVE METABOLIC PANEL
ALT: 24 IU/L (ref 0–44)
AST: 21 IU/L (ref 0–40)
Albumin/Globulin Ratio: 1.8 (ref 1.2–2.2)
Albumin: 4.8 g/dL — ABNORMAL HIGH (ref 3.7–4.7)
Alkaline Phosphatase: 134 IU/L — ABNORMAL HIGH (ref 44–121)
BUN/Creatinine Ratio: 10 (ref 10–24)
BUN: 14 mg/dL (ref 8–27)
Bilirubin Total: 0.3 mg/dL (ref 0.0–1.2)
CO2: 24 mmol/L (ref 20–29)
Calcium: 10.7 mg/dL — ABNORMAL HIGH (ref 8.6–10.2)
Chloride: 101 mmol/L (ref 96–106)
Creatinine, Ser: 1.44 mg/dL — ABNORMAL HIGH (ref 0.76–1.27)
Globulin, Total: 2.6 g/dL (ref 1.5–4.5)
Glucose: 114 mg/dL — ABNORMAL HIGH (ref 70–99)
Potassium: 3.6 mmol/L (ref 3.5–5.2)
Sodium: 142 mmol/L (ref 134–144)
Total Protein: 7.4 g/dL (ref 6.0–8.5)
eGFR: 51 mL/min/{1.73_m2} — ABNORMAL LOW (ref >59)

## 2021-09-23 LAB — HEMOGLOBIN A1C
Est. average glucose Bld gHb Est-mCnc: 123 mg/dL
Hgb A1c MFr Bld: 5.9 % — ABNORMAL HIGH (ref 4.8–5.6)

## 2021-09-23 LAB — PSA: Prostate Specific Ag, Serum: 1.4 ng/mL (ref 0.0–4.0)

## 2021-09-23 LAB — CARDIOVASCULAR RISK ASSESSMENT

## 2021-09-23 LAB — LIPID PANEL
Chol/HDL Ratio: 5.2 ratio — ABNORMAL HIGH (ref 0.0–5.0)
Cholesterol, Total: 187 mg/dL (ref 100–199)
HDL: 36 mg/dL — ABNORMAL LOW (ref >39)
LDL Chol Calc (NIH): 104 mg/dL — ABNORMAL HIGH (ref 0–99)
Triglycerides: 277 mg/dL — ABNORMAL HIGH (ref 0–149)
VLDL Cholesterol Cal: 47 mg/dL — ABNORMAL HIGH (ref 5–40)

## 2021-09-23 LAB — VITAMIN D 25 HYDROXY (VIT D DEFICIENCY, FRACTURES): Vit D, 25-Hydroxy: 15 ng/mL — ABNORMAL LOW (ref 30.0–100.0)

## 2022-03-25 ENCOUNTER — Ambulatory Visit (INDEPENDENT_AMBULATORY_CARE_PROVIDER_SITE_OTHER): Payer: Medicare Other | Admitting: Physician Assistant

## 2022-03-25 ENCOUNTER — Encounter: Payer: Self-pay | Admitting: Physician Assistant

## 2022-03-25 VITALS — BP 130/72 | HR 62 | Temp 97.4°F | Ht 71.0 in | Wt 212.2 lb

## 2022-03-25 DIAGNOSIS — E119 Type 2 diabetes mellitus without complications: Secondary | ICD-10-CM | POA: Diagnosis not present

## 2022-03-25 DIAGNOSIS — E785 Hyperlipidemia, unspecified: Secondary | ICD-10-CM

## 2022-03-25 DIAGNOSIS — E1169 Type 2 diabetes mellitus with other specified complication: Secondary | ICD-10-CM

## 2022-03-25 DIAGNOSIS — E559 Vitamin D deficiency, unspecified: Secondary | ICD-10-CM

## 2022-03-25 DIAGNOSIS — I10 Essential (primary) hypertension: Secondary | ICD-10-CM | POA: Diagnosis not present

## 2022-03-25 MED ORDER — TRIAMTERENE-HCTZ 37.5-25 MG PO TABS: 1.0000 | ORAL_TABLET | Freq: Every day | ORAL | 5 refills | Status: DC

## 2022-03-25 MED ORDER — SIMVASTATIN 40 MG PO TABS: 40.0000 mg | ORAL_TABLET | Freq: Every day | ORAL | 5 refills | Status: DC

## 2022-03-25 MED ORDER — VITAMIN D (ERGOCALCIFEROL) 1.25 MG (50000 UNIT) PO CAPS: 50000.0000 [IU] | ORAL_CAPSULE | ORAL | 5 refills | Status: DC

## 2022-03-25 NOTE — Progress Notes (Signed)
Established Patient Office Visit  Subjective:  Patient ID: Shaun Henry, male    DOB: [birth year]  Age: 76 y.o. MRN: [id number]  CC:  Chief Complaint  Patient presents with   Diabetes   HPI Shaun Henry presents for chronic follow up   Pt presents with hyperlipidemia.  Current treatment includes Zocor 70m qd - was diagnosed about 5 years ago  Compliance with treatment has been good; he takes his medication as directed, maintains his low cholesterol diet, follows up as directed, and maintains his exercise regimen.  He denies experiencing any hypercholesterolemia related symptoms.Concurrent health problems include hypertension.      Pt presents for follow up of hypertension.  Date of diagnosis 07/2009.  His current cardiac medication regimen includes Triamterene/HCTZ 37.5/25 mg QD.  .[name] He is tolerating the medication well without side effects.  Compliance with treatment has been good; he takes his medication as directed, maintains his diet and exercise regimen, and follows up as directed.      In regard to the type 2 diabetes mellitus with other specified complication, specifically, this is type 2, non-insulin requiring diabetes, complicated by hypertension and hyperlipidemia. 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   Type 2 diabetes mellitus with other specified complication (HCC)     Past Surgical History:  Procedure Laterality Date   3rd right finger surgery      Family History  Problem Relation Age of Onset   Coronary artery disease Father     Social History   Socioeconomic History   Marital  status: Married    Spouse name: Not on file   Number of children: 2   Years of education: Not on file   Highest education level: Not on file  Occupational History   Occupation: Truck DrGeophysicist/field seismologistTobacco Use   Smoking status: Some Days    Types: Cigars   Smokeless tobacco: Never   Tobacco comments:    occassional smoker Cigars.  Average 5 per week  Vaping Use   Vaping Use: Never used  Substance and Sexual Activity   Alcohol use: No   Drug use: No   Sexual activity: Not on file  Other Topics Concern   Not on file  Social History Narrative   Not on file   Social Determinants of Health   Financial Resource Strain: Not on file  Food Insecurity: Not on file  Transportation Needs: Not on file  Physical Activity: Not on file  Stress: Not on file  Social Connections: Not on file  Intimate Partner Violence: Not on file     Current Outpatient Medications:    aspirin EC 81 MG tablet, Take 81 mg by mouth daily., Disp: , Rfl:    metFORMIN (GLUCOPHAGE) 1000 MG tablet, Take 1 tablet (1,000 mg total) by mouth daily with breakfast., Disp: 60 tablet, Rfl: 5   simvastatin (ZOCOR) 40 MG tablet, Take 1 tablet (40 mg total) by mouth daily., Disp: 30 tablet, Rfl: 5   triamterene-hydrochlorothiazide (MAXZIDE-25) 37.5-25 MG tablet, Take 1 tablet by mouth daily., Disp: 30 tablet,  Rfl: 5   Vitamin D, Ergocalciferol, (DRISDOL) 1.25 MG (50000 UNIT) CAPS capsule, Take 1 capsule (50,000 Units total) by mouth every 7 (seven) days., Disp: 5 capsule, Rfl: 5   No Known Allergies  CONSTITUTIONAL: Negative for chills, fatigue, fever, unintentional weight gain and unintentional weight loss.  E/N/T: Negative for ear pain, nasal congestion and sore throat.  CARDIOVASCULAR: Negative for chest pain, dizziness, palpitations and pedal edema.  RESPIRATORY: Negative for recent cough and dyspnea.  GASTROINTESTINAL: Negative for abdominal pain, acid reflux symptoms, constipation, diarrhea, nausea and vomiting.  MSK:  Negative for arthralgias and myalgias.  INTEGUMENTARY: Negative for rash.  NEUROLOGICAL: Negative for dizziness and headaches.  PSYCHIATRIC: Negative for sleep disturbance and to question depression screen.  Negative for depression, negative for anhedonia.        Objective:   PHYSICAL EXAM:   VS: BP 130/72 (BP Location: Left Arm, Patient Position: Sitting, Cuff Size: Large)   Pulse 62   Temp (!) 97.4 F (36.3 C) (Temporal)   Ht _0  (1.803 m)   Wt 212 lb 3.2 oz (96.3 kg)   SpO2 98%   BMI 29.60 kg/m   GEN: Well nourished, well developed, in no acute distress   Cardiac: RRR; no murmurs, rubs, or gallops,no edema -  Respiratory:  normal respiratory rate and pattern with no distress - normal breath sounds with no rales, rhonchi, wheezes or rubs  MS: no deformity or atrophy  Skin: warm and dry, no rash   Psych: euthymic mood, appropriate affect and demeanor   No visits with results within 1 Day(s) from this visit.  Latest known visit with results is:  Office Visit on 09/22/2021  Component Date Value Ref Range Status   WBC 09/22/2021 5.3  3.4 - 10.8 x10E3/uL Final   RBC 09/22/2021 5.51  4.14 - 5.80 x10E6/uL Final   Hemoglobin 09/22/2021 15.2  13.0 - 17.7 g/dL Final   Hematocrit 09/22/2021 44.8  37.5 - 51.0 % Final   MCV 09/22/2021 81  79 - 97 fL Final   MCH 09/22/2021 27.6  26.6 - 33.0 pg Final   MCHC 09/22/2021 33.9  31.5 - 35.7 g/dL Final   RDW 09/22/2021 13.1  11.6 - 15.4 % Final   Platelets 09/22/2021 228  150 - 450 x10E3/uL Final   Neutrophils 09/22/2021 56  Not Estab. % Final   Lymphs 09/22/2021 33  Not Estab. % Final   Monocytes 09/22/2021 9  Not Estab. % Final   Eos 09/22/2021 1  Not Estab. % Final   Basos 09/22/2021 1  Not Estab. % Final   Neutrophils Absolute 09/22/2021 3.0  1.4 - 7.0 x10E3/uL Final   Lymphocytes Absolute 09/22/2021 1.7  0.7 - 3.1 x10E3/uL Final   Monocytes Absolute 09/22/2021 0.5  0.1 - 0.9 x10E3/uL Final   EOS (ABSOLUTE) 09/22/2021 0.1  0.0  - 0.4 x10E3/uL Final   Basophils Absolute 09/22/2021 0.0  0.0 - 0.2 x10E3/uL Final   Immature Granulocytes 09/22/2021 0  Not Estab. % Final   Immature Grans (Abs) 09/22/2021 0.0  0.0 - 0.1 x10E3/uL Final   Glucose 09/22/2021 114 (H)  70 - 99 mg/dL Final   BUN 09/22/2021 14  8 - 27 mg/dL Final   Creatinine, Ser 09/22/2021 1.44 (H)  0.76 - 1.27 mg/dL Final   eGFR 09/22/2021 51 (L)  >59 mL/min/1.73 Final   BUN/Creatinine Ratio 09/22/2021 10  10 - 24 Final   Sodium 09/22/2021 142  134 - 144 mmol/L Final   Potassium 09/22/2021  3.6  3.5 - 5.2 mmol/L Final   Chloride 09/22/2021 101  96 - 106 mmol/L Final   CO2 09/22/2021 24  20 - 29 mmol/L Final   Calcium 09/22/2021 10.7 (H)  8.6 - 10.2 mg/dL Final   Total Protein 09/22/2021 7.4  6.0 - 8.5 g/dL Final   Albumin 09/22/2021 4.8 (H)  3.7 - 4.7 g/dL Final   Globulin, Total 09/22/2021 2.6  1.5 - 4.5 g/dL Final   Albumin/Globulin Ratio 09/22/2021 1.8  1.2 - 2.2 Final   Bilirubin Total 09/22/2021 0.3  0.0 - 1.2 mg/dL Final   Alkaline Phosphatase 09/22/2021 134 (H)  44 - 121 IU/L Final   AST 09/22/2021 21  0 - 40 IU/L Final   ALT 09/22/2021 24  0 - 44 IU/L Final   Hgb A1c MFr Bld 09/22/2021 5.9 (H)  4.8 - 5.6 % Final   Comment:          Prediabetes: 5.7 - 6.4          Diabetes: >6.4          Glycemic control for adults with diabetes: <7.0    Est. average glucose Bld gHb Est-m* 09/22/2021 123  mg/dL Final   Cholesterol, Total 09/22/2021 187  100 - 199 mg/dL Final   Triglycerides 09/22/2021 277 (H)  0 - 149 mg/dL Final   HDL 09/22/2021 36 (L)  >39 mg/dL Final   VLDL Cholesterol Cal 09/22/2021 47 (H)  5 - 40 mg/dL Final   LDL Chol Calc (NIH) 09/22/2021 104 (H)  0 - 99 mg/dL Final   Chol/HDL Ratio 09/22/2021 5.2 (H)  0.0 - 5.0 ratio Final   Comment:                                   T. Chol/HDL Ratio                                             Men  Women                               1/2 Avg.Risk  3.4    3.3                                    Avg.Risk  5.0    4.4                                2X Avg.Risk  9.6    7.1                                3X Avg.Risk 23.4   11.0    Prostate Specific Ag, Serum 09/22/2021 1.4  0.0 - 4.0 ng/mL Final   Comment: Roche ECLIA methodology. According to the American Urological Association, Serum PSA should decrease and remain at undetectable levels after radical prostatectomy. The AUA defines biochemical recurrence as an initial PSA value 0.2 ng/mL or greater followed by a subsequent confirmatory PSA value 0.2 ng/mL or greater. Values obtained with different assay methods or kits cannot be  used interchangeably. Results cannot be interpreted as absolute evidence of the presence or absence of malignant disease.    Vit D, 25-Hydroxy 09/22/2021 15.0 (L)  30.0 - 100.0 ng/mL Final   Comment: Vitamin D deficiency has been defined by the [place] practice guideline as a level of serum 25-OH vitamin D less than 20 ng/mL (1,2). The Endocrine Society went on to further define vitamin D insufficiency as a level between 21 and 29 ng/mL (2). 1. IOM (Institute of Medicine). 2010. Dietary reference    intakes for calcium and D. [place]: The    [employer]. 2. Holick MF, Binkley [place], Bischoff-Ferrari HA, et al.    Evaluation, treatment, and prevention of vitamin D    deficiency: an Endocrine Society clinical practice    guideline. JCEM. 2011 Jul; 96(7):1911-30.    Color, UA 09/22/2021 yellow  yellow Final   Clarity, UA 09/22/2021 clear  clear Final   Glucose, UA 09/22/2021 negative  negative mg/dL Final   Bilirubin, UA 09/22/2021 negative  negative Final   Ketones, POC UA 09/22/2021 negative  negative mg/dL Final   Spec Grav, UA 09/22/2021 1.015  1.010 - 1.025 Final   Blood, UA 09/22/2021 negative  negative Final   pH, UA 09/22/2021 6.0  5.0 - 8.0 Final   POC PROTEIN,UA 09/22/2021 trace  negative, trace Final   Urobilinogen, UA 09/22/2021 0.2   0.2 or 1.0 E.U./dL Final   Nitrite, UA 09/22/2021 Negative  Negative Final   Leukocytes, UA 09/22/2021 Negative  Negative Final   Interpretation 09/22/2021 Note   Final   Supplemental report is available.   Diabetic Foot Exam - Simple   No data filed     No visits with results within 1 Day(s) from this visit.  Latest known visit with results is:  Office Visit on 09/22/2021  Component Date Value Ref Range Status   WBC 09/22/2021 5.3  3.4 - 10.8 x10E3/uL Final   RBC 09/22/2021 5.51  4.14 - 5.80 x10E6/uL Final   Hemoglobin 09/22/2021 15.2  13.0 - 17.7 g/dL Final   Hematocrit 09/22/2021 44.8  37.5 - 51.0 % Final   MCV 09/22/2021 81  79 - 97 fL Final   MCH 09/22/2021 27.6  26.6 - 33.0 pg Final   MCHC 09/22/2021 33.9  31.5 - 35.7 g/dL Final   RDW 09/22/2021 13.1  11.6 - 15.4 % Final   Platelets 09/22/2021 228  150 - 450 x10E3/uL Final   Neutrophils 09/22/2021 56  Not Estab. % Final   Lymphs 09/22/2021 33  Not Estab. % Final   Monocytes 09/22/2021 9  Not Estab. % Final   Eos 09/22/2021 1  Not Estab. % Final   Basos 09/22/2021 1  Not Estab. % Final   Neutrophils Absolute 09/22/2021 3.0  1.4 - 7.0 x10E3/uL Final   Lymphocytes Absolute 09/22/2021 1.7  0.7 - 3.1 x10E3/uL Final   Monocytes Absolute 09/22/2021 0.5  0.1 - 0.9 x10E3/uL Final   EOS (ABSOLUTE) 09/22/2021 0.1  0.0 - 0.4 x10E3/uL Final   Basophils Absolute 09/22/2021 0.0  0.0 - 0.2 x10E3/uL Final   Immature Granulocytes 09/22/2021 0  Not Estab. % Final   Immature Grans (Abs) 09/22/2021 0.0  0.0 - 0.1 x10E3/uL Final   Glucose 09/22/2021 114 (H)  70 - 99 mg/dL Final   BUN 09/22/2021 14  8 - 27 mg/dL Final   Creatinine, Ser 09/22/2021 1.44 (H)  0.76 - 1.27 mg/dL Final   eGFR 09/22/2021  51 (L)  >59 mL/min/1.73 Final   BUN/Creatinine Ratio 09/22/2021 10  10 - 24 Final   Sodium 09/22/2021 142  134 - 144 mmol/L Final   Potassium 09/22/2021 3.6  3.5 - 5.2 mmol/L Final   Chloride 09/22/2021 101  96 - 106 mmol/L Final   CO2 09/22/2021  24  20 - 29 mmol/L Final   Calcium 09/22/2021 10.7 (H)  8.6 - 10.2 mg/dL Final   Total Protein 09/22/2021 7.4  6.0 - 8.5 g/dL Final   Albumin 09/22/2021 4.8 (H)  3.7 - 4.7 g/dL Final   Globulin, Total 09/22/2021 2.6  1.5 - 4.5 g/dL Final   Albumin/Globulin Ratio 09/22/2021 1.8  1.2 - 2.2 Final   Bilirubin Total 09/22/2021 0.3  0.0 - 1.2 mg/dL Final   Alkaline Phosphatase 09/22/2021 134 (H)  44 - 121 IU/L Final   AST 09/22/2021 21  0 - 40 IU/L Final   ALT 09/22/2021 24  0 - 44 IU/L Final   Hgb A1c MFr Bld 09/22/2021 5.9 (H)  4.8 - 5.6 % Final   Comment:          Prediabetes: 5.7 - 6.4          Diabetes: >6.4          Glycemic control for adults with diabetes: <7.0    Est. average glucose Bld gHb Est-m* 09/22/2021 123  mg/dL Final   Cholesterol, Total 09/22/2021 187  100 - 199 mg/dL Final   Triglycerides 09/22/2021 277 (H)  0 - 149 mg/dL Final   HDL 09/22/2021 36 (L)  >39 mg/dL Final   VLDL Cholesterol Cal 09/22/2021 47 (H)  5 - 40 mg/dL Final   LDL Chol Calc (NIH) 09/22/2021 104 (H)  0 - 99 mg/dL Final   Chol/HDL Ratio 09/22/2021 5.2 (H)  0.0 - 5.0 ratio Final   Comment:                                   T. Chol/HDL Ratio                                             Men  Women                               1/2 Avg.Risk  3.4    3.3                                   Avg.Risk  5.0    4.4                                2X Avg.Risk  9.6    7.1                                3X Avg.Risk 23.4   11.0    Prostate Specific Ag, Serum 09/22/2021 1.4  0.0 - 4.0 ng/mL Final   Comment: Roche ECLIA methodology. According to the American Urological Association, Serum PSA should decrease and remain at undetectable levels after radical prostatectomy. The AUA defines  biochemical recurrence as an initial PSA value 0.2 ng/mL or greater followed by a subsequent confirmatory PSA value 0.2 ng/mL or greater. Values obtained with different assay methods or kits cannot be used interchangeably. Results cannot  be interpreted as absolute evidence of the presence or absence of malignant disease.    Vit D, 25-Hydroxy 09/22/2021 15.0 (L)  30.0 - 100.0 ng/mL Final   Comment: Vitamin D deficiency has been defined by the [place] practice guideline as a level of serum 25-OH vitamin D less than 20 ng/mL (1,2). The Endocrine Society went on to further define vitamin D insufficiency as a level between 21 and 29 ng/mL (2). 1. IOM (Institute of Medicine). 2010. Dietary reference    intakes for calcium and D. [place]: The    [employer]. 2. Holick MF, Binkley [place], Bischoff-Ferrari HA, et al.    Evaluation, treatment, and prevention of vitamin D    deficiency: an Endocrine Society clinical practice    guideline. JCEM. 2011 Jul; 96(7):1911-30.    Color, UA 09/22/2021 yellow  yellow Final   Clarity, UA 09/22/2021 clear  clear Final   Glucose, UA 09/22/2021 negative  negative mg/dL Final   Bilirubin, UA 09/22/2021 negative  negative Final   Ketones, POC UA 09/22/2021 negative  negative mg/dL Final   Spec Grav, UA 09/22/2021 1.015  1.010 - 1.025 Final   Blood, UA 09/22/2021 negative  negative Final   pH, UA 09/22/2021 6.0  5.0 - 8.0 Final   POC PROTEIN,UA 09/22/2021 trace  negative, trace Final   Urobilinogen, UA 09/22/2021 0.2  0.2 or 1.0 E.U./dL Final   Nitrite, UA 09/22/2021 Negative  Negative Final   Leukocytes, UA 09/22/2021 Negative  Negative Final   Interpretation 09/22/2021 Note   Final   Supplemental report is available.     Health Maintenance Due  Topic Date Due   HEMOGLOBIN A1C  03/25/2022    There are no preventive care reminders to display for this patient.  Lab Results  Component Value Date   TSH 0.662 05/17/2016   Lab Results  Component Value Date   WBC 5.3 09/22/2021   HGB 15.2 09/22/2021   HCT 44.8 09/22/2021   MCV 81 09/22/2021   PLT 228 09/22/2021   Lab Results  Component Value Date   NA 142 09/22/2021   K 3.6  09/22/2021   CHLORIDE 104 02/01/2014   CO2 24 09/22/2021   GLUCOSE 114 (H) 09/22/2021   BUN 14 09/22/2021   CREATININE 1.44 (H) 09/22/2021   BILITOT 0.3 09/22/2021   ALKPHOS 134 (H) 09/22/2021   AST 21 09/22/2021   ALT 24 09/22/2021   PROT 7.4 09/22/2021   ALBUMIN 4.8 (H) 09/22/2021   CALCIUM 10.7 (H) 09/22/2021   ANIONGAP 9 02/01/2014   EGFR 51 (L) 09/22/2021   Lab Results  Component Value Date   CHOL 187 09/22/2021   Lab Results  Component Value Date   HDL 36 (L) 09/22/2021   Lab Results  Component Value Date   LDLCALC 104 (H) 09/22/2021   Lab Results  Component Value Date   TRIG 277 (H) 09/22/2021   Lab Results  Component Value Date   CHOLHDL 5.2 (H) 09/22/2021   Lab Results  Component Value Date   HGBA1C 5.9 (H) 09/22/2021      Assessment & Plan:   Problem List Items Addressed This Visit       Cardiovascular and Mediastinum   Essential hypertension   Relevant  Medications   simvastatin (ZOCOR) 40 MG tablet   triamterene-hydrochlorothiazide (MAXZIDE-25) 37.5-25 MG tablet   Other Relevant Orders   CBC with Differential/Platelet   Comprehensive metabolic panel     Endocrine   Diabetes mellitus without complication (HCC)   Relevant Medications   metFORMIN (GLUCOPHAGE) 1000 MG tablet   simvastatin (ZOCOR) 40 MG tablet   Other Relevant Orders   CBC with Differential/Platelet   Comprehensive metabolic panel   Hemoglobin A1c      Hyperlipidemia associated with type 2 diabetes mellitus ([place]) - Primary   Relevant Medications   metFORMIN (GLUCOPHAGE) 1000 MG tablet   simvastatin (ZOCOR) 40 MG tablet   triamterene-hydrochlorothiazide (MAXZIDE-25) 37.5-25 MG tablet   Other Relevant Orders   Lipid panel     Other   Vitamin D insufficiency   Relevant Medications   Vitamin D, Ergocalciferol, (DRISDOL) 1.25 MG (50000 UNIT) CAPS capsule   Other Relevant Orders   VITAMIN D 25 Hydroxy (Vit-D Deficiency, Fractures)         Meds ordered this  encounter  Medications   simvastatin (ZOCOR) 40 MG tablet    Sig: Take 1 tablet (40 mg total) by mouth daily.    Dispense:  30 tablet    Refill:  5    Order Specific Question:   Supervising Provider    Answer:   Shelton Silvas   triamterene-hydrochlorothiazide (SELTRVU-02) 37.5-25 MG tablet    Sig: Take 1 tablet by mouth daily.    Dispense:  30 tablet    Refill:  5    Order Specific Question:   Supervising Provider    Answer:   Shelton Silvas   Vitamin D, Ergocalciferol, (DRISDOL) 1.25 MG (50000 UNIT) CAPS capsule    Sig: Take 1 capsule (50,000 Units total) by mouth every 7 (seven) days.    Dispense:  5 capsule    Refill:  5    Order Specific Question:   Supervising Provider    Answer:   Shelton Silvas    Follow-up: Return in about 6 months (around 09/23/2022) for chronic fasting follow up.    SARA R Mihir Flanigan, PA-C

## 2022-03-26 ENCOUNTER — Other Ambulatory Visit: Payer: Self-pay | Admitting: Physician Assistant

## 2022-03-26 DIAGNOSIS — R899 Unspecified abnormal finding in specimens from other organs, systems and tissues: Secondary | ICD-10-CM

## 2022-03-26 DIAGNOSIS — E559 Vitamin D deficiency, unspecified: Secondary | ICD-10-CM

## 2022-03-26 LAB — VITAMIN D 25 HYDROXY (VIT D DEFICIENCY, FRACTURES): Vit D, 25-Hydroxy: 18.1 ng/mL — ABNORMAL LOW (ref 30.0–100.0)

## 2022-03-26 LAB — HEMOGLOBIN A1C
Est. average glucose Bld gHb Est-mCnc: 126 mg/dL
Hgb A1c MFr Bld: 6 % — ABNORMAL HIGH (ref 4.8–5.6)

## 2022-03-26 LAB — COMPREHENSIVE METABOLIC PANEL
ALT: 18 IU/L (ref 0–44)
AST: 18 IU/L (ref 0–40)
Albumin/Globulin Ratio: 1.7 (ref 1.2–2.2)
Albumin: 4.6 g/dL (ref 3.8–4.8)
Alkaline Phosphatase: 133 IU/L — ABNORMAL HIGH (ref 44–121)
BUN/Creatinine Ratio: 7 — ABNORMAL LOW (ref 10–24)
BUN: 9 mg/dL (ref 8–27)
Bilirubin Total: 0.2 mg/dL (ref 0.0–1.2)
CO2: 26 mmol/L (ref 20–29)
Calcium: 10.9 mg/dL — ABNORMAL HIGH (ref 8.6–10.2)
Chloride: 100 mmol/L (ref 96–106)
Creatinine, Ser: 1.21 mg/dL (ref 0.76–1.27)
Globulin, Total: 2.7 g/dL (ref 1.5–4.5)
Glucose: 88 mg/dL (ref 70–99)
Potassium: 3.8 mmol/L (ref 3.5–5.2)
Sodium: 141 mmol/L (ref 134–144)
Total Protein: 7.3 g/dL (ref 6.0–8.5)
eGFR: 63 mL/min/{1.73_m2} (ref >59)

## 2022-03-26 LAB — LIPID PANEL
Chol/HDL Ratio: 5.4 ratio — ABNORMAL HIGH (ref 0.0–5.0)
Cholesterol, Total: 172 mg/dL (ref 100–199)
HDL: 32 mg/dL — ABNORMAL LOW (ref >39)
LDL Chol Calc (NIH): 107 mg/dL — ABNORMAL HIGH (ref 0–99)
Triglycerides: 189 mg/dL — ABNORMAL HIGH (ref 0–149)
VLDL Cholesterol Cal: 33 mg/dL (ref 5–40)

## 2022-03-26 LAB — CBC WITH DIFFERENTIAL/PLATELET
Basophils Absolute: 0 10*3/uL (ref 0.0–0.2)
Basos: 1 %
EOS (ABSOLUTE): 0.1 10*3/uL (ref 0.0–0.4)
Eos: 1 %
Hematocrit: 43.5 % (ref 37.5–51.0)
Hemoglobin: 14.3 g/dL (ref 13.0–17.7)
Immature Grans (Abs): 0 10*3/uL (ref 0.0–0.1)
Immature Granulocytes: 0 %
Lymphocytes Absolute: 1.9 10*3/uL (ref 0.7–3.1)
Lymphs: 36 %
MCH: 26.8 pg (ref 26.6–33.0)
MCHC: 32.9 g/dL (ref 31.5–35.7)
MCV: 82 fL (ref 79–97)
Monocytes Absolute: 0.4 10*3/uL (ref 0.1–0.9)
Monocytes: 7 %
Neutrophils Absolute: 2.8 10*3/uL (ref 1.4–7.0)
Neutrophils: 55 %
Platelets: 231 10*3/uL (ref 150–450)
RBC: 5.33 x10E6/uL (ref 4.14–5.80)
RDW: 13.4 % (ref 11.6–15.4)
WBC: 5.2 10*3/uL (ref 3.4–10.8)

## 2022-03-26 LAB — CARDIOVASCULAR RISK ASSESSMENT

## 2022-03-26 MED ORDER — VITAMIN D (ERGOCALCIFEROL) 1.25 MG (50000 UNIT) PO CAPS: 50000.0000 [IU] | ORAL_CAPSULE | ORAL | 5 refills | Status: DC

## 2022-07-02 DIAGNOSIS — Z23 Encounter for immunization: Secondary | ICD-10-CM | POA: Diagnosis not present

## 2022-09-27 ENCOUNTER — Ambulatory Visit (INDEPENDENT_AMBULATORY_CARE_PROVIDER_SITE_OTHER): Payer: Medicare Other | Admitting: Physician Assistant

## 2022-09-27 ENCOUNTER — Encounter: Payer: Self-pay | Admitting: Physician Assistant

## 2022-09-27 VITALS — BP 130/70 | HR 73 | Temp 96.0°F | Ht 71.0 in | Wt 201.2 lb

## 2022-09-27 DIAGNOSIS — R634 Abnormal weight loss: Secondary | ICD-10-CM

## 2022-09-27 DIAGNOSIS — E119 Type 2 diabetes mellitus without complications: Secondary | ICD-10-CM | POA: Diagnosis not present

## 2022-09-27 DIAGNOSIS — E1169 Type 2 diabetes mellitus with other specified complication: Secondary | ICD-10-CM

## 2022-09-27 DIAGNOSIS — Z125 Encounter for screening for malignant neoplasm of prostate: Secondary | ICD-10-CM

## 2022-09-27 DIAGNOSIS — E559 Vitamin D deficiency, unspecified: Secondary | ICD-10-CM

## 2022-09-27 DIAGNOSIS — E785 Hyperlipidemia, unspecified: Secondary | ICD-10-CM

## 2022-09-27 DIAGNOSIS — I10 Essential (primary) hypertension: Secondary | ICD-10-CM | POA: Diagnosis not present

## 2022-09-27 NOTE — Progress Notes (Signed)
Established Patient Office Visit  Subjective:  Patient ID: Shaun Henry, male    DOB: 1945/09/14  Age: 77 y.o. MRN: [id number]  CC:  Chief Complaint  Patient presents with   Diabetes   Hypertension   HPI Shaun Henry presents for chronic follow up   Pt presents with hyperlipidemia.  Current treatment includes Zocor 40mg  qd - was diagnosed about 6 years ago  Compliance with treatment has been good; he takes his medication as directed, maintains his low cholesterol diet, follows up as directed, and maintains his exercise regimen.  He denies experiencing any hypercholesterolemia related symptoms.Concurrent health problems include hypertension.      Pt presents for follow up of hypertension.  Date of diagnosis 07/2009.  His current cardiac medication regimen includes Triamterene/HCTZ 37.5/25 mg QD.  [name]  He is tolerating the medication well without side effects.  Compliance with treatment has been good; he takes his medication as directed, maintains his diet and exercise regimen, and follows up as directed.      In regard to the type 2 diabetes mellitus with other specified complication, specifically, this is type 2, non-insulin requiring diabetes, complicated by hypertension and hyperlipidemia. Pt states he has checked his sugars some but cannot recall the values - he is currently on glucophage 1000mg  qd - states he is due to get eye exam  Pt with history of vit D deficiency - pt states he is taking supplement weekly  It is noted that he has lost about 10 pounds since last visit.  He says that he is not eating a lot and usually only eats once daily.  Denies nausea, vomiting, or abdominal pain.  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    Type 2 diabetes mellitus with other specified complication (HCC)     Past Surgical History:  Procedure Laterality Date   3rd right finger surgery      Family History  Problem Relation Age of Onset   Coronary artery disease Father     Social History   Socioeconomic History   Marital status: Married    Spouse name: Not on file   Number of children: 2   Years of education: Not on file   Highest education level: Not on file  Occupational History   Occupation: Truck Geophysicist/field seismologist  Tobacco Use   Smoking status: Some Days    Types: Cigars   Smokeless tobacco: Never   Tobacco comments:    occassional smoker Cigars.  Average 5 per week  Vaping Use   Vaping Use: Never used  Substance and Sexual Activity   Alcohol use: No   Drug use: No   Sexual activity: Not on file  Other Topics Concern   Not on file  Social History Narrative   Not on file   Social Determinants of Health   Financial Resource Strain: Not on file  Food Insecurity: Not on file  Transportation Needs: Not on file  Physical Activity: Not on file  Stress: Not on file  Social Connections: Not on file  Intimate Partner Violence: Not on file     Current Outpatient Medications:    aspirin EC 81 MG tablet, Take 81 mg by mouth daily., Disp: ,  Rfl:    metFORMIN (GLUCOPHAGE) 1000 MG tablet, Take 1 tablet (1,000 mg total) by mouth daily with breakfast., Disp: 60 tablet, Rfl: 5   simvastatin (ZOCOR) 40 MG tablet, Take 1 tablet (40 mg total) by mouth daily., Disp: 30 tablet, Rfl: 5   triamterene-hydrochlorothiazide (MAXZIDE-25) 37.5-25 MG tablet, Take 1 tablet by mouth daily., Disp: 30 tablet, Rfl: 5   Vitamin D, Ergocalciferol, (DRISDOL) 1.25 MG (50000 UNIT) CAPS capsule, Take 1 capsule (50,000 Units total) by mouth every 7 (seven) days., Disp: 5 capsule, Rfl: 5   No Known Allergies  CONSTITUTIONAL: Negative for chills, fatigue, fever, E/N/T: Negative for ear pain, nasal congestion and sore throat.  CARDIOVASCULAR:  Negative for chest pain, dizziness, palpitations and pedal edema.  RESPIRATORY: Negative for recent cough and dyspnea.  GASTROINTESTINAL: Negative for abdominal pain, acid reflux symptoms, constipation, diarrhea, nausea and vomiting.  MSK: Negative for arthralgias and myalgias.  INTEGUMENTARY: Negative for rash.  NEUROLOGICAL: Negative for dizziness and headaches.  PSYCHIATRIC: Negative for sleep disturbance and to question depression screen.  Negative for depression, negative for anhedonia.        Objective:  PHYSICAL EXAM:   VS: BP 130/70 (BP Location: Right Arm, Patient Position: Sitting, Cuff Size: Large)   Pulse 73   Temp (!) 96 F (35.6 C) (Temporal)   Ht 5\' 11"  (1.803 m)   Wt 201 lb 3.2 oz (91.3 kg)   SpO2 97%   BMI 28.06 kg/m   GEN: Well nourished, well developed, in no acute distress  Cardiac: RRR; no murmurs, rubs, or gallops,no edema -  Respiratory:  normal respiratory rate and pattern with no distress - normal breath sounds with no rales, rhonchi, wheezes or rubs  MS: no deformity or atrophy  Skin: warm and dry, no rash   Psych: euthymic mood, appropriate affect and demeanor   No visits with results within 1 Day(s) from this visit.  Latest known visit with results is:  Office Visit on 03/25/2022  Component Date Value Ref Range Status   WBC 03/25/2022 5.2  3.4 - 10.8 x10E3/uL Final   RBC 03/25/2022 5.33  4.14 - 5.80 x10E6/uL Final   Hemoglobin 03/25/2022 14.3  13.0 - 17.7 g/dL Final   Hematocrit 03/25/2022 43.5  37.5 - 51.0 % Final   MCV 03/25/2022 82  79 - 97 fL Final   MCH 03/25/2022 26.8  26.6 - 33.0 pg Final   MCHC 03/25/2022 32.9  31.5 - 35.7 g/dL Final   RDW 03/25/2022 13.4  11.6 - 15.4 % Final   Platelets 03/25/2022 231  150 - 450 x10E3/uL Final   Neutrophils 03/25/2022 55  Not Estab. % Final   Lymphs 03/25/2022 36  Not Estab. % Final   Monocytes 03/25/2022 7  Not Estab. % Final   Eos 03/25/2022 1  Not Estab. % Final   Basos 03/25/2022 1  Not Estab.  % Final   Neutrophils Absolute 03/25/2022 2.8  1.4 - 7.0 x10E3/uL Final   Lymphocytes Absolute 03/25/2022 1.9  0.7 - 3.1 x10E3/uL Final   Monocytes Absolute 03/25/2022 0.4  0.1 - 0.9 x10E3/uL Final   EOS (ABSOLUTE) 03/25/2022 0.1  0.0 - 0.4 x10E3/uL Final   Basophils Absolute 03/25/2022 0.0  0.0 - 0.2 x10E3/uL Final   Immature Granulocytes 03/25/2022 0  Not Estab. % Final   Immature Grans (Abs) 03/25/2022 0.0  0.0 - 0.1 x10E3/uL Final   Glucose 03/25/2022 88  70 - 99 mg/dL Final   BUN 03/25/2022 9  8 - 27  mg/dL Final   Creatinine, Ser 03/25/2022 1.21  0.76 - 1.27 mg/dL Final   eGFR 03/25/2022 63  >59 mL/min/1.73 Final   BUN/Creatinine Ratio 03/25/2022 7 (L)  10 - 24 Final   Sodium 03/25/2022 141  134 - 144 mmol/L Final   Potassium 03/25/2022 3.8  3.5 - 5.2 mmol/L Final   Chloride 03/25/2022 100  96 - 106 mmol/L Final   CO2 03/25/2022 26  20 - 29 mmol/L Final   Calcium 03/25/2022 10.9 (H)  8.6 - 10.2 mg/dL Final   Total Protein 03/25/2022 7.3  6.0 - 8.5 g/dL Final   Albumin 03/25/2022 4.6  3.8 - 4.8 g/dL Final   Globulin, Total 03/25/2022 2.7  1.5 - 4.5 g/dL Final   Albumin/Globulin Ratio 03/25/2022 1.7  1.2 - 2.2 Final   Bilirubin Total 03/25/2022 0.2  0.0 - 1.2 mg/dL Final   Alkaline Phosphatase 03/25/2022 133 (H)  44 - 121 IU/L Final   AST 03/25/2022 18  0 - 40 IU/L Final   ALT 03/25/2022 18  0 - 44 IU/L Final   Cholesterol, Total 03/25/2022 172  100 - 199 mg/dL Final   Triglycerides 03/25/2022 189 (H)  0 - 149 mg/dL Final   HDL 03/25/2022 32 (L)  >39 mg/dL Final   VLDL Cholesterol Cal 03/25/2022 33  5 - 40 mg/dL Final   LDL Chol Calc (NIH) 03/25/2022 107 (H)  0 - 99 mg/dL Final   Chol/HDL Ratio 03/25/2022 5.4 (H)  0.0 - 5.0 ratio Final   Comment:                                   T. Chol/HDL Ratio                                             Men  Women                               1/2 Avg.Risk  3.4    3.3                                   Avg.Risk  5.0    4.4                                 2X Avg.Risk  9.6    7.1                                3X Avg.Risk 23.4   11.0    Hgb A1c MFr Bld 03/25/2022 6.0 (H)  4.8 - 5.6 % Final   Comment:          Prediabetes: 5.7 - 6.4          Diabetes: >6.4          Glycemic control for adults with diabetes: <7.0    Est. average glucose Bld gHb Est-m* 03/25/2022 126  mg/dL Final   Vit D, 25-Hydroxy 03/25/2022 18.1 (L)  30.0 - 100.0 ng/mL Final   Comment: Vitamin D deficiency has been defined by the  [place] practice guideline as a level of serum 25-OH vitamin D less than 20 ng/mL (1,2). The Endocrine Society went on to further define vitamin D insufficiency as a level between 21 and 29 ng/mL (2). 1. IOM (Institute of Medicine). 2010. Dietary reference    intakes for calcium and D. [place]: The    [employer]. 2. Holick MF, Binkley [place], Bischoff-Ferrari HA, et al.    Evaluation, treatment, and prevention of vitamin D    deficiency: an Endocrine Society clinical practice    guideline. JCEM. 2011 Jul; 96(7):1911-30.    Interpretation 03/25/2022 Note   Final   Supplemental report is available.   Diabetic Foot Exam - Simple   No data filed     No visits with results within 1 Day(s) from this visit.  Latest known visit with results is:  Office Visit on 03/25/2022  Component Date Value Ref Range Status   WBC 03/25/2022 5.2  3.4 - 10.8 x10E3/uL Final   RBC 03/25/2022 5.33  4.14 - 5.80 x10E6/uL Final   Hemoglobin 03/25/2022 14.3  13.0 - 17.7 g/dL Final   Hematocrit 03/25/2022 43.5  37.5 - 51.0 % Final   MCV 03/25/2022 82  79 - 97 fL Final   MCH 03/25/2022 26.8  26.6 - 33.0 pg Final   MCHC 03/25/2022 32.9  31.5 - 35.7 g/dL Final   RDW 03/25/2022 13.4  11.6 - 15.4 % Final   Platelets 03/25/2022 231  150 - 450 x10E3/uL Final   Neutrophils 03/25/2022 55  Not Estab. % Final   Lymphs 03/25/2022 36  Not Estab. % Final   Monocytes 03/25/2022 7  Not Estab. % Final   Eos  03/25/2022 1  Not Estab. % Final   Basos 03/25/2022 1  Not Estab. % Final   Neutrophils Absolute 03/25/2022 2.8  1.4 - 7.0 x10E3/uL Final   Lymphocytes Absolute 03/25/2022 1.9  0.7 - 3.1 x10E3/uL Final   Monocytes Absolute 03/25/2022 0.4  0.1 - 0.9 x10E3/uL Final   EOS (ABSOLUTE) 03/25/2022 0.1  0.0 - 0.4 x10E3/uL Final   Basophils Absolute 03/25/2022 0.0  0.0 - 0.2 x10E3/uL Final   Immature Granulocytes 03/25/2022 0  Not Estab. % Final   Immature Grans (Abs) 03/25/2022 0.0  0.0 - 0.1 x10E3/uL Final   Glucose 03/25/2022 88  70 - 99 mg/dL Final   BUN 03/25/2022 9  8 - 27 mg/dL Final   Creatinine, Ser 03/25/2022 1.21  0.76 - 1.27 mg/dL Final   eGFR 03/25/2022 63  >59 mL/min/1.73 Final   BUN/Creatinine Ratio 03/25/2022 7 (L)  10 - 24 Final   Sodium 03/25/2022 141  134 - 144 mmol/L Final   Potassium 03/25/2022 3.8  3.5 - 5.2 mmol/L Final   Chloride 03/25/2022 100  96 - 106 mmol/L Final   CO2 03/25/2022 26  20 - 29 mmol/L Final   Calcium 03/25/2022 10.9 (H)  8.6 - 10.2 mg/dL Final   Total Protein 03/25/2022 7.3  6.0 - 8.5 g/dL Final   Albumin 03/25/2022 4.6  3.8 - 4.8 g/dL Final   Globulin, Total 03/25/2022 2.7  1.5 - 4.5 g/dL Final   Albumin/Globulin Ratio 03/25/2022 1.7  1.2 - 2.2 Final   Bilirubin Total 03/25/2022 0.2  0.0 - 1.2 mg/dL Final   Alkaline Phosphatase 03/25/2022 133 (H)  44 - 121 IU/L Final   AST 03/25/2022 18  0 - 40 IU/L Final   ALT 03/25/2022 18  0 - 44 IU/L  Final   Cholesterol, Total 03/25/2022 172  100 - 199 mg/dL Final   Triglycerides 03/25/2022 189 (H)  0 - 149 mg/dL Final   HDL 03/25/2022 32 (L)  >39 mg/dL Final   VLDL Cholesterol Cal 03/25/2022 33  5 - 40 mg/dL Final   LDL Chol Calc (NIH) 03/25/2022 107 (H)  0 - 99 mg/dL Final   Chol/HDL Ratio 03/25/2022 5.4 (H)  0.0 - 5.0 ratio Final   Comment:                                   T. Chol/HDL Ratio                                             Men  Women                               1/2 Avg.Risk  3.4    3.3                                    Avg.Risk  5.0    4.4                                2X Avg.Risk  9.6    7.1                                3X Avg.Risk 23.4   11.0    Hgb A1c MFr Bld 03/25/2022 6.0 (H)  4.8 - 5.6 % Final   Comment:          Prediabetes: 5.7 - 6.4          Diabetes: >6.4          Glycemic control for adults with diabetes: <7.0    Est. average glucose Bld gHb Est-m* 03/25/2022 126  mg/dL Final   Vit D, 25-Hydroxy 03/25/2022 18.1 (L)  30.0 - 100.0 ng/mL Final   Comment: Vitamin D deficiency has been defined by the [place] practice guideline as a level of serum 25-OH vitamin D less than 20 ng/mL (1,2). The Endocrine Society went on to further define vitamin D insufficiency as a level between 21 and 29 ng/mL (2). 1. IOM (Institute of Medicine). 2010. Dietary reference    intakes for calcium and D. [place]: The    [employer]. 2. Holick MF, Binkley [place], Bischoff-Ferrari HA, et al.    Evaluation, treatment, and prevention of vitamin D    deficiency: an Endocrine Society clinical practice    guideline. JCEM. 2011 Jul; 96(7):1911-30.    Interpretation 03/25/2022 Note   Final   Supplemental report is available.     Health Maintenance Due  Topic Date Due   Medicare Annual Wellness (AWV)  Never done   DTaP/Tdap/Td (1 - Tdap) Never done   OPHTHALMOLOGY EXAM  10/14/2020   FOOT EXAM  09/23/2022   HEMOGLOBIN A1C  09/23/2022    There are no preventive care reminders to display for this patient.  Lab Results  Component  Value Date   TSH 0.662 05/17/2016   Lab Results  Component Value Date   WBC 5.2 03/25/2022   HGB 14.3 03/25/2022   HCT 43.5 03/25/2022   MCV 82 03/25/2022   PLT 231 03/25/2022   Lab Results  Component Value Date   NA 141 03/25/2022   K 3.8 03/25/2022   CHLORIDE 104 02/01/2014   CO2 26 03/25/2022   GLUCOSE 88 03/25/2022   BUN 9 03/25/2022   CREATININE 1.21 03/25/2022   BILITOT 0.2 03/25/2022    ALKPHOS 133 (H) 03/25/2022   AST 18 03/25/2022   ALT 18 03/25/2022   PROT 7.3 03/25/2022   ALBUMIN 4.6 03/25/2022   CALCIUM 10.9 (H) 03/25/2022   ANIONGAP 9 02/01/2014   EGFR 63 03/25/2022   Lab Results  Component Value Date   CHOL 172 03/25/2022   Lab Results  Component Value Date   HDL 32 (L) 03/25/2022   Lab Results  Component Value Date   LDLCALC 107 (H) 03/25/2022   Lab Results  Component Value Date   TRIG 189 (H) 03/25/2022   Lab Results  Component Value Date   CHOLHDL 5.4 (H) 03/25/2022   Lab Results  Component Value Date   HGBA1C 6.0 (H) 03/25/2022      Assessment & Plan:   Problem List Items Addressed This Visit       Cardiovascular and Mediastinum   Essential hypertension   Relevant Medications   simvastatin (ZOCOR) 40 MG tablet   triamterene-hydrochlorothiazide (MAXZIDE-25) 37.5-25 MG tablet   Other Relevant Orders   CBC with Differential/Platelet   Comprehensive metabolic panel     Endocrine   Diabetes mellitus without complication (HCC)   Relevant Medications   metFORMIN (GLUCOPHAGE) 1000 MG tablet   simvastatin (ZOCOR) 40 MG tablet   Other Relevant Orders   CBC with Differential/Platelet   Comprehensive metabolic panel   Hemoglobin A1c      Hyperlipidemia associated with type 2 diabetes mellitus (HCC) - Primary   Relevant Medications   metFORMIN (GLUCOPHAGE) 1000 MG tablet   simvastatin (ZOCOR) 40 MG tablet   triamterene-hydrochlorothiazide (MAXZIDE-25) 37.5-25 MG tablet   Other Relevant Orders   Lipid panel     Other   Vitamin D insufficiency   Relevant Medications   Vitamin D, Ergocalciferol, (DRISDOL) 1.25 MG (50000 UNIT) CAPS capsule   Other Relevant Orders   VITAMIN D 25 Hydroxy (Vit-D Deficiency, Fractures)    Weight loss Recommend efforts at better eating (more than once daily) Weight recheck in one month -- if continues to lose weight recommend GI referral     No orders of the defined types were placed in this  encounter.   Follow-up: Return in about 6 months (around 03/30/2023) for chronic fasting follow up.    SARA R Dalessandro Baldyga, PA-C

## 2022-09-28 LAB — CBC WITH DIFFERENTIAL/PLATELET
Basophils Absolute: 0 10*3/uL (ref 0.0–0.2)
Basos: 1 %
EOS (ABSOLUTE): 0.1 10*3/uL (ref 0.0–0.4)
Eos: 2 %
Hematocrit: 40.2 % (ref 37.5–51.0)
Hemoglobin: 13.3 g/dL (ref 13.0–17.7)
Immature Grans (Abs): 0 10*3/uL (ref 0.0–0.1)
Immature Granulocytes: 0 %
Lymphocytes Absolute: 1.6 10*3/uL (ref 0.7–3.1)
Lymphs: 33 %
MCH: 27.6 pg (ref 26.6–33.0)
MCHC: 33.1 g/dL (ref 31.5–35.7)
MCV: 83 fL (ref 79–97)
Monocytes Absolute: 0.3 10*3/uL (ref 0.1–0.9)
Monocytes: 7 %
Neutrophils Absolute: 2.7 10*3/uL (ref 1.4–7.0)
Neutrophils: 57 %
Platelets: 213 10*3/uL (ref 150–450)
RBC: 4.82 x10E6/uL (ref 4.14–5.80)
RDW: 13.4 % (ref 11.6–15.4)
WBC: 4.7 10*3/uL (ref 3.4–10.8)

## 2022-09-28 LAB — COMPREHENSIVE METABOLIC PANEL
ALT: 16 IU/L (ref 0–44)
AST: 18 IU/L (ref 0–40)
Albumin/Globulin Ratio: 1.7 (ref 1.2–2.2)
Albumin: 4.5 g/dL (ref 3.8–4.8)
Alkaline Phosphatase: 111 IU/L (ref 44–121)
BUN/Creatinine Ratio: 7 — ABNORMAL LOW (ref 10–24)
BUN: 7 mg/dL — ABNORMAL LOW (ref 8–27)
Bilirubin Total: 0.2 mg/dL (ref 0.0–1.2)
CO2: 25 mmol/L (ref 20–29)
Calcium: 10.3 mg/dL — ABNORMAL HIGH (ref 8.6–10.2)
Chloride: 99 mmol/L (ref 96–106)
Creatinine, Ser: 1.01 mg/dL (ref 0.76–1.27)
Globulin, Total: 2.7 g/dL (ref 1.5–4.5)
Glucose: 109 mg/dL — ABNORMAL HIGH (ref 70–99)
Potassium: 4 mmol/L (ref 3.5–5.2)
Sodium: 141 mmol/L (ref 134–144)
Total Protein: 7.2 g/dL (ref 6.0–8.5)
eGFR: 78 mL/min/{1.73_m2} (ref >59)

## 2022-09-28 LAB — LIPID PANEL
Chol/HDL Ratio: 3.6 ratio (ref 0.0–5.0)
Cholesterol, Total: 143 mg/dL (ref 100–199)
HDL: 40 mg/dL (ref >39)
LDL Chol Calc (NIH): 81 mg/dL (ref 0–99)
Triglycerides: 119 mg/dL (ref 0–149)
VLDL Cholesterol Cal: 22 mg/dL (ref 5–40)

## 2022-09-28 LAB — HEMOGLOBIN A1C
Est. average glucose Bld gHb Est-mCnc: 123 mg/dL
Hgb A1c MFr Bld: 5.9 % — ABNORMAL HIGH (ref 4.8–5.6)

## 2022-09-28 LAB — CARDIOVASCULAR RISK ASSESSMENT

## 2022-10-06 ENCOUNTER — Other Ambulatory Visit: Payer: Self-pay | Admitting: Physician Assistant

## 2022-10-06 DIAGNOSIS — E1169 Type 2 diabetes mellitus with other specified complication: Secondary | ICD-10-CM

## 2022-10-25 ENCOUNTER — Ambulatory Visit: Payer: Medicare Other

## 2023-02-24 ENCOUNTER — Other Ambulatory Visit: Payer: Self-pay | Admitting: Physician Assistant

## 2023-02-24 DIAGNOSIS — I10 Essential (primary) hypertension: Secondary | ICD-10-CM

## 2023-03-30 ENCOUNTER — Ambulatory Visit (INDEPENDENT_AMBULATORY_CARE_PROVIDER_SITE_OTHER): Payer: Medicare Other | Admitting: Physician Assistant

## 2023-03-30 ENCOUNTER — Encounter: Payer: Self-pay | Admitting: Physician Assistant

## 2023-03-30 VITALS — BP 120/68 | HR 92 | Temp 96.8°F | Ht 71.0 in | Wt 207.4 lb

## 2023-03-30 DIAGNOSIS — R350 Frequency of micturition: Secondary | ICD-10-CM | POA: Diagnosis not present

## 2023-03-30 DIAGNOSIS — E559 Vitamin D deficiency, unspecified: Secondary | ICD-10-CM | POA: Diagnosis not present

## 2023-03-30 DIAGNOSIS — E785 Hyperlipidemia, unspecified: Secondary | ICD-10-CM | POA: Diagnosis not present

## 2023-03-30 DIAGNOSIS — E1159 Type 2 diabetes mellitus with other circulatory complications: Secondary | ICD-10-CM | POA: Diagnosis not present

## 2023-03-30 DIAGNOSIS — R3589 Other polyuria: Secondary | ICD-10-CM

## 2023-03-30 DIAGNOSIS — Z125 Encounter for screening for malignant neoplasm of prostate: Secondary | ICD-10-CM

## 2023-03-30 DIAGNOSIS — E1169 Type 2 diabetes mellitus with other specified complication: Secondary | ICD-10-CM | POA: Diagnosis not present

## 2023-03-30 DIAGNOSIS — E119 Type 2 diabetes mellitus without complications: Secondary | ICD-10-CM

## 2023-03-30 DIAGNOSIS — I152 Hypertension secondary to endocrine disorders: Secondary | ICD-10-CM

## 2023-03-30 LAB — POCT URINALYSIS DIP (CLINITEK)
Bilirubin, UA: NEGATIVE
Blood, UA: NEGATIVE
Glucose, UA: NEGATIVE mg/dL
Ketones, POC UA: NEGATIVE mg/dL
Leukocytes, UA: NEGATIVE
Nitrite, UA: NEGATIVE
POC PROTEIN,UA: NEGATIVE
Spec Grav, UA: 1.02 (ref 1.010–1.025)
Urobilinogen, UA: 0.2 U/dL
pH, UA: 6 (ref 5.0–8.0)

## 2023-03-30 NOTE — Progress Notes (Signed)
Established Patient Office Visit  Subjective:  Patient ID: Shaun Henry, male    DOB: [birth year]  Age: 77 y.o. MRN: [id number]  CC:  Chief Complaint  Patient presents with   Medical Management of Chronic Issues   HPI Shaun Henry presents for chronic follow up   Pt presents with hyperlipidemia.  Current treatment includes Zocor 40mg  qd - was diagnosed about 7 years ago  Compliance with treatment has been good; he takes his medication as directed, maintains his low cholesterol diet, follows up as directed, and maintains his exercise regimen.  He denies experiencing any hypercholesterolemia related symptoms.Concurrent health problems include hypertension.      Pt presents for follow up of hypertension.  Date of diagnosis 07/2009.  His current cardiac medication regimen includes Triamterene/HCTZ 37.5/25 mg QD.  [name]  He is tolerating the medication well without side effects.  Compliance with treatment has been good; he takes his medication as directed, maintains his diet and exercise regimen, and follows up as directed.      In regard to the type 2 diabetes mellitus with other specified complication, specifically, this is type 2, non-insulin requiring diabetes, complicated by hypertension and hyperlipidemia. 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   Type 2 diabetes mellitus with other specified complication (HCC)      Past Surgical History:  Procedure Laterality Date   3rd right finger surgery      Family History  Problem Relation Age of Onset   Coronary artery disease Father     Social History   Socioeconomic History   Marital status: Married    Spouse name: Not on file   Number of children: 2   Years of education: Not on file   Highest education level: Not on file  Occupational History   Occupation: [employer] doctor  Tobacco Use   Smoking status: Some Days    Types: Cigars   Smokeless tobacco: Never   Tobacco comments:    occassional smoker Cigars.  Average 5 per week  Vaping Use   Vaping status: Never Used  Substance and Sexual Activity   Alcohol use: No   Drug use: No   Sexual activity: Not on file  Other Topics Concern   Not on file  Social History Narrative   Not on file   Social Determinants of Health   Financial Resource Strain: Low Risk  (03/30/2023)   Overall Financial Resource Strain (CARDIA)    Difficulty of Paying Living Expenses: Not hard at all  Food Insecurity: No Food Insecurity (03/30/2023)   Hunger Vital Sign    Worried About Running Out of Food in the Last Year: Never true    Ran Out of Food in the Last Year: Never true  Transportation Needs: No Transportation Needs (03/30/2023)   PRAPARE - Transportation    Lack  of Transportation (Medical): No    Lack of Transportation (Non-Medical): No  Physical Activity: Insufficiently Active (03/30/2023)   Exercise Vital Sign    Days of Exercise per Week: 3 days    Minutes of Exercise per Session: 30 min  Stress: No Stress Concern Present (03/30/2023)   [employer] of Occupational Health - Occupational Stress Questionnaire    Feeling of Stress : Not at all  Social Connections: Moderately Isolated (03/30/2023)   Social Connection and Isolation Panel [NHANES]    Frequency of Communication with Friends and Family: More than three times a week    Frequency of Social Gatherings with Friends and Family: Three times a  week    Attends Religious Services: Never    Active Member of Clubs or Organizations: No    Attends Banker Meetings: Never    Marital Status: Married  Catering manager Violence: Not At Risk (03/30/2023)   Humiliation, Afraid, Rape, and Kick questionnaire    Fear of Current or Ex-Partner: No    Emotionally Abused: No    Physically Abused: No    Sexually Abused: No     Current Outpatient Medications:    aspirin EC 81 MG tablet, Take 81 mg by mouth daily., Disp: , Rfl:    metFORMIN (GLUCOPHAGE) 1000 MG tablet, Take 1 tablet (1,000 mg total) by mouth daily with breakfast., Disp: 60 tablet, Rfl: 5   simvastatin (ZOCOR) 40 MG tablet, TAKE 1 TABLET BY MOUTH EVERY DAY, Disp: 30 tablet, Rfl: 5   triamterene-hydrochlorothiazide (MAXZIDE-25) 37.5-25 MG tablet, TAKE 1 TABLET BY MOUTH EVERY DAY, Disp: 30 tablet, Rfl: 5   Vitamin D, Ergocalciferol, (DRISDOL) 1.25 MG (50000 UNIT) CAPS capsule, Take 1 capsule (50,000 Units total) by mouth every 7 (seven) days., Disp: 5 capsule, Rfl: 5   No Known Allergies  CONSTITUTIONAL: Negative for chills, fatigue, fever, unintentional weight gain and unintentional weight loss.  E/N/T: Negative for ear pain, nasal congestion and sore throat.  CARDIOVASCULAR: Negative for chest pain, dizziness, palpitations and pedal edema.  RESPIRATORY: Negative for recent cough and dyspnea.  GASTROINTESTINAL: Negative for abdominal pain, acid reflux symptoms, constipation, diarrhea, nausea and vomiting.  GU- see HPI MSK: Negative for arthralgias and myalgias.  INTEGUMENTARY: Negative for rash.  NEUROLOGICAL: Negative for dizziness and headaches.  PSYCHIATRIC: Negative for sleep disturbance and to question depression screen.  Negative for depression, negative for anhedonia.        Objective:  PHYSICAL EXAM:   VS: BP 120/68 (BP Location: Left Arm, Patient Position: Sitting, Cuff Size: Large)   Pulse 92   Temp (!) 96.8 F (36 C) (Temporal)   Ht 5\' 11"  (1.803  m)   Wt 207 lb 6.4 oz (94.1 kg)   SpO2 97%   BMI 28.93 kg/m   GEN: Well nourished, well developed, in no acute distress  Cardiac: RRR; no murmurs, rubs, or gallops,no edema  Respiratory:  normal respiratory rate and pattern with no distress - normal breath sounds with no rales, rhonchi, wheezes or rubs GI: normal bowel sounds, no masses or tenderness MS: no deformity or atrophy  Skin: warm and dry, no rash  Psych: euthymic mood, appropriate affect and demeanor  No visits with results within 1 Day(s) from this visit.  Latest known visit with results is:  Office Visit on 09/27/2022  Component Date Value Ref Range Status   WBC 09/27/2022 4.7  3.4 - 10.8 x10E3/uL Final   RBC 09/27/2022 4.82  4.14 - 5.80 x10E6/uL Final   Hemoglobin  09/27/2022 13.3  13.0 - 17.7 g/dL Final   Hematocrit 56/21/3086 40.2  37.5 - 51.0 % Final   MCV 09/27/2022 83  79 - 97 fL Final   MCH 09/27/2022 27.6  26.6 - 33.0 pg Final   MCHC 09/27/2022 33.1  31.5 - 35.7 g/dL Final   RDW 57/84/6962 13.4  11.6 - 15.4 % Final   Platelets 09/27/2022 213  150 - 450 x10E3/uL Final   Neutrophils 09/27/2022 57  Not Estab. % Final   Lymphs 09/27/2022 33  Not Estab. % Final   Monocytes 09/27/2022 7  Not Estab. % Final   Eos 09/27/2022 2  Not Estab. % Final   Basos 09/27/2022 1  Not Estab. % Final   Neutrophils Absolute 09/27/2022 2.7  1.4 - 7.0 x10E3/uL Final   Lymphocytes Absolute 09/27/2022 1.6  0.7 - 3.1 x10E3/uL Final   Monocytes Absolute 09/27/2022 0.3  0.1 - 0.9 x10E3/uL Final   EOS (ABSOLUTE) 09/27/2022 0.1  0.0 - 0.4 x10E3/uL Final   Basophils Absolute 09/27/2022 0.0  0.0 - 0.2 x10E3/uL Final   Immature Granulocytes 09/27/2022 0  Not Estab. % Final   Immature Grans (Abs) 09/27/2022 0.0  0.0 - 0.1 x10E3/uL Final   Glucose 09/27/2022 109 (H)  70 - 99 mg/dL Final   BUN 95/28/4132 7 (L)  8 - 27 mg/dL Final   Creatinine, Ser 09/27/2022 1.01  0.76 - 1.27 mg/dL Final   eGFR 44/07/270 78  >59 mL/min/1.73 Final    BUN/Creatinine Ratio 09/27/2022 7 (L)  10 - 24 Final   Sodium 09/27/2022 141  134 - 144 mmol/L Final   Potassium 09/27/2022 4.0  3.5 - 5.2 mmol/L Final   Chloride 09/27/2022 99  96 - 106 mmol/L Final   CO2 09/27/2022 25  20 - 29 mmol/L Final   Calcium 09/27/2022 10.3 (H)  8.6 - 10.2 mg/dL Final   Total Protein 53/66/4403 7.2  6.0 - 8.5 g/dL Final   Albumin 47/42/5956 4.5  3.8 - 4.8 g/dL Final   Globulin, Total 09/27/2022 2.7  1.5 - 4.5 g/dL Final   Albumin/Globulin Ratio 09/27/2022 1.7  1.2 - 2.2 Final   Bilirubin Total 09/27/2022 0.2  0.0 - 1.2 mg/dL Final   Alkaline Phosphatase 09/27/2022 111  44 - 121 IU/L Final   AST 09/27/2022 18  0 - 40 IU/L Final   ALT 09/27/2022 16  0 - 44 IU/L Final   Cholesterol, Total 09/27/2022 143  100 - 199 mg/dL Final   Triglycerides 38/75/6433 119  0 - 149 mg/dL Final   HDL 29/51/8841 40  >39 mg/dL Final   VLDL Cholesterol Cal 09/27/2022 22  5 - 40 mg/dL Final   LDL Chol Calc (NIH) 09/27/2022 81  0 - 99 mg/dL Final   Chol/HDL Ratio 09/27/2022 3.6  0.0 - 5.0 ratio Final   Comment:                                   T. Chol/HDL Ratio                                             Men  Women  1/2 Avg.Risk  3.4    3.3                                   Avg.Risk  5.0    4.4                                2X Avg.Risk  9.6    7.1                                3X Avg.Risk 23.4   11.0    Hgb A1c MFr Bld 09/27/2022 5.9 (H)  4.8 - 5.6 % Final   Comment:          Prediabetes: 5.7 - 6.4          Diabetes: >6.4          Glycemic control for adults with diabetes: <7.0    Est. average glucose Bld gHb Est-m* 09/27/2022 123  mg/dL Final   Interpretation 09/27/2022 Note   Final   Supplemental report is available.   Diabetic Foot Exam - Simple   Simple Foot Form Diabetic Foot exam was performed with the following findings: Yes 03/30/2023  2:44 PM  Visual Inspection No deformities, no ulcerations, no other skin breakdown bilaterally:  Yes Sensation Testing Intact to touch and monofilament testing bilaterally: Yes Pulse Check Posterior Tibialis and Dorsalis pulse intact bilaterally: Yes Comments     No visits with results within 1 Day(s) from this visit.  Latest known visit with results is:  Office Visit on 09/27/2022  Component Date Value Ref Range Status   WBC 09/27/2022 4.7  3.4 - 10.8 x10E3/uL Final   RBC 09/27/2022 4.82  4.14 - 5.80 x10E6/uL Final   Hemoglobin 09/27/2022 13.3  13.0 - 17.7 g/dL Final   Hematocrit 11/91/4782 40.2  37.5 - 51.0 % Final   MCV 09/27/2022 83  79 - 97 fL Final   MCH 09/27/2022 27.6  26.6 - 33.0 pg Final   MCHC 09/27/2022 33.1  31.5 - 35.7 g/dL Final   RDW 95/62/1308 13.4  11.6 - 15.4 % Final   Platelets 09/27/2022 213  150 - 450 x10E3/uL Final   Neutrophils 09/27/2022 57  Not Estab. % Final   Lymphs 09/27/2022 33  Not Estab. % Final   Monocytes 09/27/2022 7  Not Estab. % Final   Eos 09/27/2022 2  Not Estab. % Final   Basos 09/27/2022 1  Not Estab. % Final   Neutrophils Absolute 09/27/2022 2.7  1.4 - 7.0 x10E3/uL Final   Lymphocytes Absolute 09/27/2022 1.6  0.7 - 3.1 x10E3/uL Final   Monocytes Absolute 09/27/2022 0.3  0.1 - 0.9 x10E3/uL Final   EOS (ABSOLUTE) 09/27/2022 0.1  0.0 - 0.4 x10E3/uL Final   Basophils Absolute 09/27/2022 0.0  0.0 - 0.2 x10E3/uL Final   Immature Granulocytes 09/27/2022 0  Not Estab. % Final   Immature Grans (Abs) 09/27/2022 0.0  0.0 - 0.1 x10E3/uL Final   Glucose 09/27/2022 109 (H)  70 - 99 mg/dL Final   BUN 65/78/4696 7 (L)  8 - 27 mg/dL Final   Creatinine, Ser 09/27/2022 1.01  0.76 - 1.27 mg/dL Final   eGFR 29/52/8413 78  >59 mL/min/1.73 Final   BUN/Creatinine Ratio 09/27/2022 7 (L)  10 - 24 Final   Sodium 09/27/2022  141  134 - 144 mmol/L Final   Potassium 09/27/2022 4.0  3.5 - 5.2 mmol/L Final   Chloride 09/27/2022 99  96 - 106 mmol/L Final   CO2 09/27/2022 25  20 - 29 mmol/L Final   Calcium 09/27/2022 10.3 (H)  8.6 - 10.2 mg/dL Final   Total  Protein 09/27/2022 7.2  6.0 - 8.5 g/dL Final   Albumin 16/04/9603 4.5  3.8 - 4.8 g/dL Final   Globulin, Total 09/27/2022 2.7  1.5 - 4.5 g/dL Final   Albumin/Globulin Ratio 09/27/2022 1.7  1.2 - 2.2 Final   Bilirubin Total 09/27/2022 0.2  0.0 - 1.2 mg/dL Final   Alkaline Phosphatase 09/27/2022 111  44 - 121 IU/L Final   AST 09/27/2022 18  0 - 40 IU/L Final   ALT 09/27/2022 16  0 - 44 IU/L Final   Cholesterol, Total 09/27/2022 143  100 - 199 mg/dL Final   Triglycerides 54/03/8118 119  0 - 149 mg/dL Final   HDL 14/78/2956 40  >39 mg/dL Final   VLDL Cholesterol Cal 09/27/2022 22  5 - 40 mg/dL Final   LDL Chol Calc (NIH) 09/27/2022 81  0 - 99 mg/dL Final   Chol/HDL Ratio 09/27/2022 3.6  0.0 - 5.0 ratio Final   Comment:                                   T. Chol/HDL Ratio                                             Men  Women                               1/2 Avg.Risk  3.4    3.3                                   Avg.Risk  5.0    4.4                                2X Avg.Risk  9.6    7.1                                3X Avg.Risk 23.4   11.0    Hgb A1c MFr Bld 09/27/2022 5.9 (H)  4.8 - 5.6 % Final   Comment:          Prediabetes: 5.7 - 6.4          Diabetes: >6.4          Glycemic control for adults with diabetes: <7.0    Est. average glucose Bld gHb Est-m* 09/27/2022 123  mg/dL Final   Interpretation 09/27/2022 Note   Final   Supplemental report is available.     Health Maintenance Due  Topic Date Due   Medicare Annual Wellness (AWV)  Never done   Diabetic kidney evaluation - Urine ACR  Never done   DTaP/Tdap/Td (1 - Tdap) Never done   OPHTHALMOLOGY EXAM  10/14/2020   INFLUENZA VACCINE  02/10/2023   HEMOGLOBIN A1C  03/30/2023    There are no preventive care reminders to display for this patient.  Lab Results  Component Value Date   TSH 0.662 05/17/2016   Lab Results  Component Value Date   WBC 4.7 09/27/2022   HGB 13.3 09/27/2022   HCT 40.2 09/27/2022   MCV 83  09/27/2022   PLT 213 09/27/2022   Lab Results  Component Value Date   NA 141 09/27/2022   K 4.0 09/27/2022   CHLORIDE 104 02/01/2014   CO2 25 09/27/2022   GLUCOSE 109 (H) 09/27/2022   BUN 7 (L) 09/27/2022   CREATININE 1.01 09/27/2022   BILITOT 0.2 09/27/2022   ALKPHOS 111 09/27/2022   AST 18 09/27/2022   ALT 16 09/27/2022   PROT 7.2 09/27/2022   ALBUMIN 4.5 09/27/2022   CALCIUM 10.3 (H) 09/27/2022   ANIONGAP 9 02/01/2014   EGFR 78 09/27/2022   Lab Results  Component Value Date   CHOL 143 09/27/2022   Lab Results  Component Value Date   HDL 40 09/27/2022   Lab Results  Component Value Date   LDLCALC 81 09/27/2022   Lab Results  Component Value Date   TRIG 119 09/27/2022   Lab Results  Component Value Date   CHOLHDL 3.6 09/27/2022   Lab Results  Component Value Date   HGBA1C 5.9 (H) 09/27/2022      Assessment & Plan:   Problem List Items Addressed This Visit       Cardiovascular and Mediastinum   Hypertension associated with diabetes (HCC)   Relevant Medications   simvastatin (ZOCOR) 40 MG tablet   triamterene-hydrochlorothiazide (MAXZIDE-25) 37.5-25 MG tablet   Other Relevant Orders   CBC with Differential/Platelet   Comprehensive metabolic panel     Endocrine   Diabetes mellitus without complication (HCC)   Relevant Medications   metFORMIN (GLUCOPHAGE) 1000 MG tablet   simvastatin (ZOCOR) 40 MG tablet   Other Relevant Orders   CBC with Differential/Platelet   Comprehensive metabolic panel   Hemoglobin A1c      Hyperlipidemia associated with type 2 diabetes mellitus (HCC) - Primary   Relevant Medications   metFORMIN (GLUCOPHAGE) 1000 MG tablet   simvastatin (ZOCOR) 40 MG tablet   triamterene-hydrochlorothiazide (MAXZIDE-25) 37.5-25 MG tablet   Other Relevant Orders   Lipid panel     Other   Vitamin D insufficiency   Relevant Medications   Vitamin D, Ergocalciferol, (DRISDOL) 1.25 MG (50000 UNIT) CAPS capsule   Other Relevant Orders    VITAMIN D 25 Hydroxy (Vit-D Deficiency, Fractures)  Polyuria Ua normal PSA pending        No orders of the defined types were placed in this encounter.   Follow-up: Return in about 6 months (around 09/27/2023) for chronic fasting follow-up.    SARA R Idonia Zollinger, PA-C

## 2023-03-31 LAB — COMPREHENSIVE METABOLIC PANEL
ALT: 31 IU/L (ref 0–44)
AST: 27 IU/L (ref 0–40)
Albumin: 4.4 g/dL (ref 3.8–4.8)
Alkaline Phosphatase: 144 IU/L — ABNORMAL HIGH (ref 44–121)
BUN/Creatinine Ratio: 11 (ref 10–24)
BUN: 12 mg/dL (ref 8–27)
Bilirubin Total: 0.3 mg/dL (ref 0.0–1.2)
CO2: 25 mmol/L (ref 20–29)
Calcium: 10.9 mg/dL — ABNORMAL HIGH (ref 8.6–10.2)
Chloride: 99 mmol/L (ref 96–106)
Creatinine, Ser: 1.1 mg/dL (ref 0.76–1.27)
Globulin, Total: 3 g/dL (ref 1.5–4.5)
Glucose: 77 mg/dL (ref 70–99)
Potassium: 4.5 mmol/L (ref 3.5–5.2)
Sodium: 140 mmol/L (ref 134–144)
Total Protein: 7.4 g/dL (ref 6.0–8.5)
eGFR: 70 mL/min/{1.73_m2} (ref >59)

## 2023-03-31 LAB — CBC WITH DIFFERENTIAL/PLATELET
Basophils Absolute: 0 10*3/uL (ref 0.0–0.2)
Basos: 1 %
EOS (ABSOLUTE): 0 10*3/uL (ref 0.0–0.4)
Eos: 1 %
Hematocrit: 44.3 % (ref 37.5–51.0)
Hemoglobin: 14.6 g/dL (ref 13.0–17.7)
Immature Grans (Abs): 0 10*3/uL (ref 0.0–0.1)
Immature Granulocytes: 0 %
Lymphocytes Absolute: 1.8 10*3/uL (ref 0.7–3.1)
Lymphs: 30 %
MCH: 27.1 pg (ref 26.6–33.0)
MCHC: 33 g/dL (ref 31.5–35.7)
MCV: 82 fL (ref 79–97)
Monocytes Absolute: 0.6 10*3/uL (ref 0.1–0.9)
Monocytes: 10 %
Neutrophils Absolute: 3.4 10*3/uL (ref 1.4–7.0)
Neutrophils: 58 %
Platelets: 258 10*3/uL (ref 150–450)
RBC: 5.39 x10E6/uL (ref 4.14–5.80)
RDW: 14.8 % (ref 11.6–15.4)
WBC: 5.8 10*3/uL (ref 3.4–10.8)

## 2023-03-31 LAB — MICROALBUMIN / CREATININE URINE RATIO
Creatinine, Urine: 99.2 mg/dL
Microalb/Creat Ratio: 12 mg/g creat (ref 0–29)
Microalbumin, Urine: 12 ug/mL

## 2023-03-31 LAB — HEMOGLOBIN A1C
Est. average glucose Bld gHb Est-mCnc: 137 mg/dL
Hgb A1c MFr Bld: 6.4 % — ABNORMAL HIGH (ref 4.8–5.6)

## 2023-03-31 LAB — TSH: TSH: 0.764 u[IU]/mL (ref 0.450–4.500)

## 2023-03-31 LAB — LIPID PANEL
Chol/HDL Ratio: 4.5 ratio (ref 0.0–5.0)
Cholesterol, Total: 165 mg/dL (ref 100–199)
HDL: 37 mg/dL — ABNORMAL LOW (ref >39)
LDL Chol Calc (NIH): 92 mg/dL (ref 0–99)
Triglycerides: 209 mg/dL — ABNORMAL HIGH (ref 0–149)
VLDL Cholesterol Cal: 36 mg/dL (ref 5–40)

## 2023-03-31 LAB — PSA: Prostate Specific Ag, Serum: 1.6 ng/mL (ref 0.0–4.0)

## 2023-05-18 ENCOUNTER — Ambulatory Visit (INDEPENDENT_AMBULATORY_CARE_PROVIDER_SITE_OTHER): Payer: Medicare Other | Admitting: Emergency Medicine

## 2023-05-18 VITALS — Ht 70.0 in | Wt 207.0 lb

## 2023-05-18 DIAGNOSIS — Z0000 Encounter for general adult medical examination without abnormal findings: Secondary | ICD-10-CM | POA: Diagnosis not present

## 2023-05-18 NOTE — Progress Notes (Signed)
Subjective:   Shaun Henry is a 77 y.o. male who presents for an Initial Medicare Annual Wellness Visit.  Visit Complete: Virtual I connected with  Shaun Henry on 05/18/23 by a audio enabled telemedicine application and verified that I am speaking with the correct person using two identifiers.  Patient Location: Home  Provider Location: Home Office  I discussed the limitations of evaluation and management by telemedicine. The patient expressed understanding and agreed to proceed.  Vital Signs: Because this visit was a virtual/telehealth visit, some criteria may be missing or patient reported. Any vitals not documented were not able to be obtained and vitals that have been documented are patient reported.   Cardiac Risk Factors include: advanced age (>74men, >64 women);male gender;diabetes mellitus;hypertension;dyslipidemia     Objective:    Today's Vitals   05/18/23 1038  Weight: 207 lb (93.9 kg)  Height: 5\' 10"  (1.778 m)   Body mass index is 29.7 kg/m.     05/18/2023   10:49 AM 05/17/2016    3:50 PM 08/16/2014    4:13 PM 04/19/2014    5:04 PM 01/23/2014    2:23 PM  Advanced Directives  Does Patient Have a Medical Advance Directive? No No No No Patient does not have advance directive;Patient would not like information  Would patient like information on creating a medical advance directive? Yes (MAU/Ambulatory/Procedural Areas - Information given)  No - patient declined information No - patient declined information     Current Medications (verified) Outpatient Encounter Medications as of 05/18/2023  Medication Sig   aspirin EC 81 MG tablet Take 81 mg by mouth daily.   metFORMIN (GLUCOPHAGE) 1000 MG tablet Take 1 tablet (1,000 mg total) by mouth daily with breakfast.   simvastatin (ZOCOR) 40 MG tablet TAKE 1 TABLET BY MOUTH EVERY DAY   triamterene-hydrochlorothiazide (MAXZIDE-25) 37.5-25 MG tablet TAKE 1 TABLET BY MOUTH EVERY DAY   Vitamin D, Ergocalciferol, (DRISDOL) 1.25  MG (50000 UNIT) CAPS capsule Take 1 capsule (50,000 Units total) by mouth every 7 (seven) days.   No facility-administered encounter medications on file as of 05/18/2023.    Allergies (verified) Patient has no known allergies.   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   Type 2 diabetes mellitus with other specified complication (HCC)    Past Surgical History:  Procedure Laterality Date   3rd right finger surgery     Family History  Problem Relation Age of Onset   Other Mother        "natural death" unknown age   Coronary artery disease Father    Social History   Socioeconomic History   Marital status: Married    Spouse name: Carley Hammed   Number of children: 2   Years of education: Not on file   Highest education level: Not on file  Occupational History   Occupation: [employer] doctor   Occupation: retired  Tobacco Use   Smoking status: Some Days    Types: Cigars   Smokeless tobacco: Never   Tobacco comments:    occassional smoker Cigars.  Average 5 per week  Vaping Use   Vaping status: Never Used  Substance and Sexual Activity   Alcohol use: No   Drug use: No   Sexual activity: Not on file  Other Topics Concern  Not on file  Social History Narrative   Not on file   Social Determinants of Health   Financial Resource Strain: Low Risk  (05/18/2023)   Overall Financial Resource Strain (CARDIA)    Difficulty of Paying Living Expenses: Not hard at all  Food Insecurity: No Food Insecurity (05/18/2023)   Hunger Vital Sign    Worried About Running Out of Food in the Last Year: Never true    Ran Out of Food in the Last Year: Never true  Transportation Needs: No Transportation Needs (05/18/2023)   PRAPARE - Administrator, Civil Service (Medical): No    Lack of Transportation  (Non-Medical): No  Physical Activity: Insufficiently Active (05/18/2023)   Exercise Vital Sign    Days of Exercise per Week: 3 days    Minutes of Exercise per Session: 30 min  Stress: No Stress Concern Present (05/18/2023)   [employer] of Occupational Health - Occupational Stress Questionnaire    Feeling of Stress : Not at all  Social Connections: Moderately Isolated (05/18/2023)   Social Connection and Isolation Panel [NHANES]    Frequency of Communication with Friends and Family: Twice a week    Frequency of Social Gatherings with Friends and Family: Once a week    Attends Religious Services: Never    Database administrator or Organizations: No    Attends Engineer, structural: Never    Marital Status: Married    Tobacco Counseling Ready to quit: Not Answered Counseling given: Not Answered Tobacco comments: occassional smoker Cigars.  Average 5 per week   Clinical Intake:  Pre-visit preparation completed: Yes  Pain : No/denies pain     BMI - recorded: 29.7 Nutritional Status: BMI 25 -29 Overweight Nutritional Risks: None Diabetes: Yes CBG done?: No Did pt. bring in CBG monitor from home?: No  How often do you need to have someone help you when you read instructions, pamphlets, or other written materials from your doctor or pharmacy?: 3 - Sometimes What is the last grade level you completed in [employer]?: 8th grade  Interpreter Needed?: No  Information entered by :: Tora Kindred, CMA   Activities of Daily Living    05/18/2023   10:41 AM  In your present state of health, do you have any difficulty performing the following activities:  Hearing? 0  Vision? 0  Difficulty concentrating or making decisions? 0  Walking or climbing stairs? 0  Dressing or bathing? 0  Doing errands, shopping? 0  Preparing Food and eating ? N  Using the Toilet? N  In the past six months, have you accidently leaked urine? N  Do you have problems with loss of bowel control? N   Managing your Medications? N  Managing your Finances? Y  Comment wife Programme researcher, broadcasting/film/video or managing your Housekeeping? N    Patient Care Team: Marianne Sofia, Cordelia Poche as PCP - General (Physician Assistant) Lonie Peak, MD as Attending Physician (Radiation Oncology) Suzanna Obey, MD as Consulting Physician (Otolaryngology)  Indicate any recent Medical Services you may have received from other than [facility] providers in the past year (date may be approximate).     Assessment:   This is a routine wellness examination for [facility].  Hearing/Vision screen Hearing Screening - Comments:: Denies hearing loss Vision Screening - Comments:: Gets routine eye exams   Goals Addressed               This Visit's Progress     Patient Stated (pt-stated)  Eat more fruits and vegetables      Depression Screen    05/18/2023   10:46 AM 03/30/2023    2:44 PM 09/27/2022    2:59 PM 03/25/2022    3:20 PM 09/22/2021    3:20 PM 03/25/2021    3:22 PM 10/11/2019   10:59 PM  PHQ 2/9 Scores  PHQ - 2 Score 0 0 0 0 0 0 0  PHQ- 9 Score 0   0       Fall Risk    05/18/2023   10:50 AM 03/30/2023    2:44 PM 09/27/2022    2:59 PM 09/22/2021    3:20 PM 05/17/2016    3:52 PM  Fall Risk   Falls in the past year? 1 0 0 0 No  Number falls in past yr: 0 0 0 0   Injury with Fall? 0 0 0 0   Risk for fall due to : History of fall(s);Orthopedic patient No Fall Risks No Fall Risks    Follow up Falls evaluation completed;Education provided;Falls prevention discussed Falls evaluation completed Falls evaluation completed      MEDICARE RISK AT HOME: Medicare Risk at Home Any stairs in or around the home?: Yes If so, are there any without handrails?: No Home free of loose throw rugs in walkways, pet beds, electrical cords, etc?: Yes Adequate lighting in your home to reduce risk of falls?: Yes Life alert?: Yes (on alarm system) Use of a cane, walker or w/c?: No Grab bars in the bathroom?: Yes Shower  chair or bench in shower?: No Elevated toilet seat or a handicapped toilet?: Yes  TIMED UP AND GO:  Was the test performed? No    Cognitive Function:        05/18/2023   10:51 AM  6CIT Screen  What Year? 0 points  What month? 0 points  What time? 0 points  Count back from 20 2 points  Months in reverse 4 points  Repeat phrase 4 points  Total Score 10 points    Immunizations Immunization History  Administered Date(s) Administered   Influenza-Unspecified 03/12/2018   Moderna Sars-Covid-2 Vaccination 09/13/2019, 10/11/2019    TDAP status: Due, Education has been provided regarding the importance of this vaccine. Advised may receive this vaccine at local pharmacy or Health Dept. Aware to provide a copy of the vaccination record if obtained from local pharmacy or Health Dept. Verbalized acceptance and understanding. Patient declined.  Flu Vaccine status: Due, Education has been provided regarding the importance of this vaccine. Advised may receive this vaccine at local pharmacy or Health Dept. Aware to provide a copy of the vaccination record if obtained from local pharmacy or Health Dept. Verbalized acceptance and understanding.  Pneumococcal vaccine status: Declined,  Education has been provided regarding the importance of this vaccine but patient still declined. Advised may receive this vaccine at local pharmacy or Health Dept. Aware to provide a copy of the vaccination record if obtained from local pharmacy or Health Dept. Verbalized acceptance and understanding.   Covid-19 vaccine status: Declined, Education has been provided regarding the importance of this vaccine but patient still declined. Advised may receive this vaccine at local pharmacy or Health Dept.or vaccine clinic. Aware to provide a copy of the vaccination record if obtained from local pharmacy or Health Dept. Verbalized acceptance and understanding.  Qualifies for Shingles Vaccine? Yes   Zostavax completed No    Shingrix Completed?: No.    Education has been provided regarding the importance of this  vaccine. Patient has been advised to call insurance company to determine out of pocket expense if they have not yet received this vaccine. Advised may also receive vaccine at local pharmacy or Health Dept. Verbalized acceptance and understanding.  Screening Tests Health Maintenance  Topic Date Due   DTaP/Tdap/Td (1 - Tdap) Never done   INFLUENZA VACCINE  10/10/2023 (Originally 02/10/2023)   HEMOGLOBIN A1C  09/27/2023   OPHTHALMOLOGY EXAM  03/14/2024   Diabetic kidney evaluation - eGFR measurement  03/29/2024   Diabetic kidney evaluation - Urine ACR  03/29/2024   FOOT EXAM  03/29/2024   Medicare Annual Wellness (AWV)  05/17/2024   HPV VACCINES  Aged Out   Pneumonia Vaccine 48+ Years old  Discontinued   COVID-19 Vaccine  Discontinued   Hepatitis C Screening  Discontinued   Fecal DNA (Cologuard)  Discontinued   Zoster Vaccines- Shingrix  Discontinued    Health Maintenance  Health Maintenance Due  Topic Date Due   DTaP/Tdap/Td (1 - Tdap) Never done    Colorectal cancer screening: No longer required.   Lung Cancer Screening: (Low Dose CT Chest recommended if Age 86-80 years, 20 pack-year currently smoking OR have quit w/in 15years.) does not qualify.   Lung Cancer Screening Referral: n/a  Additional Screening:  Hepatitis C Screening: does qualify; Patient declined.  Vision Screening: Recommended annual ophthalmology exams for early detection of glaucoma and other disorders of the eye. Is the patient up to date with their annual eye exam?  Yes  Who is the provider or what is the name of the office in which the patient attends annual eye exams? [facility] If pt is not established with a provider, would they like to be referred to a provider to establish care? No .   Dental Screening: Recommended annual dental exams for proper oral hygiene  Diabetic Foot Exam: Diabetic Foot  Exam: Completed 03/30/23  Community Resource Referral / Chronic Care Management: CRR required this visit?  No   CCM required this visit?  No    Plan:     I have personally reviewed and noted the following in the patient's chart:   Medical and social history Use of alcohol, tobacco or illicit drugs  Current medications and supplements including opioid prescriptions. Patient is not currently taking opioid prescriptions. Functional ability and status Nutritional status Physical activity Advanced directives List of other physicians Hospitalizations, surgeries, and ER visits in previous 12 months Vitals Screenings to include cognitive, depression, and falls Referrals and appointments  In addition, I have reviewed and discussed with patient certain preventive protocols, quality metrics, and best practice recommendations. A written personalized care plan for preventive services as well as general preventive health recommendations were provided to patient.     Tora Kindred, CMA   05/18/2023   After Visit Summary: (Mail) Due to this being a telephonic visit, the after visit summary with patients personalized plan was offered to patient via mail   Nurse Notes:  6 CIT Score - 10 Declined DM & Nutrition education Declined Tdap, shingles, pneumonia and covid vaccines. Plans to get flu shot in December.

## 2023-05-18 NOTE — Patient Instructions (Addendum)
Shaun Henry , Thank you for taking time to come for your Medicare Wellness Visit. I appreciate your ongoing commitment to your health goals. Please review the following plan we discussed and let me know if I can assist you in the future.   Referrals/Orders/Follow-Ups/Clinician Recommendations: Get the flu shot at your earliest convenience.  This is a list of the screening recommended for you and due dates:  Health Maintenance  Topic Date Due   DTaP/Tdap/Td vaccine (1 - Tdap) Never done   Flu Shot  10/10/2023*   Hemoglobin A1C  09/27/2023   Eye exam for diabetics  03/14/2024   Yearly kidney function blood test for diabetes  03/29/2024   Yearly kidney health urinalysis for diabetes  03/29/2024   Complete foot exam   03/29/2024   Medicare Annual Wellness Visit  05/17/2024   HPV Vaccine  Aged Out   Pneumonia Vaccine  Discontinued   COVID-19 Vaccine  Discontinued   Hepatitis C Screening  Discontinued   Cologuard (Stool DNA test)  Discontinued   Zoster (Shingles) Vaccine  Discontinued  *Topic was postponed. The date shown is not the original due date.    Advanced directives: (ACP Link)Information on Advanced Care Planning can be found at [facility] Directives Advance Health Care Directives ([email or web address]   Next Medicare Annual Wellness Visit scheduled for next year: Yes, 05/21/24 @ 3:00pm  Fall Prevention in the Home, Adult Falls can cause injuries and affect people of all ages. There are many simple things that you can do to make your home safe and to help prevent falls. If you need it, ask for help making these changes. What actions can I take to prevent falls? General information Use good lighting in all rooms. Make sure to: Replace any light bulbs that burn out. Turn on lights if it is dark and use night-lights. Keep items that you use often in easy-to-reach places. Lower the shelves around your home if needed. Move furniture so that there  are clear paths around it. Do not keep throw rugs or other things on the floor that can make you trip. If any of your floors are uneven, fix them. Add color or contrast paint or tape to clearly mark and help you see: Grab bars or handrails. First and last steps of staircases. Where the edge of each step is. If you use a ladder or stepladder: Make sure that it is fully opened. Do not climb a closed ladder. Make sure the sides of the ladder are locked in place. Have someone hold the ladder while you use it. Know where your pets are as you move through your home. What can I do in the bathroom?     Keep the floor dry. Clean up any water that is on the floor right away. Remove soap buildup in the bathtub or shower. Buildup makes bathtubs and showers slippery. Use non-skid mats or decals on the floor of the bathtub or shower. Attach bath mats securely with double-sided, non-slip rug tape. If you need to sit down while you are in the shower, use a non-slip stool. Install grab bars by the toilet and in the bathtub and shower. Do not use towel bars as grab bars. What can I do in the bedroom? Make sure that you have a light by your bed that is easy to reach. Do not use any sheets or blankets on your bed that hang to the floor. Have a firm bench or chair with side  arms that you can use for support when you get dressed. What can I do in the kitchen? Clean up any spills right away. If you need to reach something above you, use a sturdy step stool that has a grab bar. Keep electrical cables out of the way. Do not use floor polish or wax that makes floors slippery. What can I do with my stairs? Do not leave anything on the stairs. Make sure that you have a light switch at the top and the bottom of the stairs. Have them installed if you do not have them. Make sure that there are handrails on both sides of the stairs. Fix handrails that are broken or loose. Make sure that handrails are as long as  the staircases. Install non-slip stair treads on all stairs in your home if they do not have carpet. Avoid having throw rugs at the top or bottom of stairs, or secure the rugs with carpet tape to prevent them from moving. Choose a carpet design that does not hide the edge of steps on the stairs. Make sure that carpet is firmly attached to the stairs. Fix any carpet that is loose or worn. What can I do on the outside of my home? Use bright outdoor lighting. Repair the edges of walkways and driveways and fix any cracks. Clear paths of anything that can make you trip, such as tools or rocks. Add color or contrast paint or tape to clearly mark and help you see high doorway thresholds. Trim any bushes or trees on the main path into your home. Check that handrails are securely fastened and in good repair. Both sides of all steps should have handrails. Install guardrails along the edges of any raised decks or porches. Have leaves, snow, and ice cleared regularly. Use sand, salt, or ice melt on walkways during winter months if you live where there is ice and snow. In the garage, clean up any spills right away, including grease or oil spills. What other actions can I take? Review your medicines with your health care provider. Some medicines can make you confused or feel dizzy. This can increase your chance of falling. Wear closed-toe shoes that fit well and support your feet. Wear shoes that have rubber soles and low heels. Use a cane, walker, scooter, or crutches that help you move around if needed. Talk with your provider about other ways that you can decrease your risk of falls. This may include seeing a physical therapist to learn to do exercises to improve movement and strength. Where to find more information Centers for Disease Control and Prevention, STEADI: TonerPromos.no [employer] on Aging: BaseRingTones.pl National Institute on Aging: BaseRingTones.pl Contact a health care provider if: You are afraid  of falling at home. You feel weak, drowsy, or dizzy at home. You fall at home. Get help right away if you: Lose consciousness or have trouble moving after a fall. Have a fall that causes a head injury. These symptoms may be an emergency. Get help right away. Call 911. Do not wait to see if the symptoms will go away. Do not drive yourself to the hospital. This information is not intended to replace advice given to you by your health care provider. Make sure you discuss any questions you have with your health care provider. Document Revised: 03/01/2022 Document Reviewed: 03/01/2022 Elsevier Patient Education  2024 [employer].

## 2023-09-28 ENCOUNTER — Encounter: Payer: Self-pay | Admitting: Physician Assistant

## 2023-09-28 ENCOUNTER — Ambulatory Visit: Payer: Medicare Other | Admitting: Physician Assistant

## 2023-09-28 VITALS — BP 116/60 | HR 84 | Temp 98.0°F | Ht 70.0 in | Wt 210.0 lb

## 2023-09-28 DIAGNOSIS — E785 Hyperlipidemia, unspecified: Secondary | ICD-10-CM

## 2023-09-28 DIAGNOSIS — I10 Essential (primary) hypertension: Secondary | ICD-10-CM

## 2023-09-28 DIAGNOSIS — E119 Type 2 diabetes mellitus without complications: Secondary | ICD-10-CM

## 2023-09-28 DIAGNOSIS — E559 Vitamin D deficiency, unspecified: Secondary | ICD-10-CM | POA: Diagnosis not present

## 2023-09-28 DIAGNOSIS — E1169 Type 2 diabetes mellitus with other specified complication: Secondary | ICD-10-CM | POA: Diagnosis not present

## 2023-09-28 MED ORDER — TRIAMTERENE-HCTZ 37.5-25 MG PO TABS
1.0000 | ORAL_TABLET | Freq: Every day | ORAL | 1 refills | Status: DC
Start: 2023-09-28 — End: 2024-06-06

## 2023-09-28 MED ORDER — SIMVASTATIN 40 MG PO TABS
40.0000 mg | ORAL_TABLET | Freq: Every day | ORAL | 1 refills | Status: DC
Start: 2023-09-28 — End: 2024-05-03

## 2023-09-28 NOTE — Progress Notes (Signed)
 Established Patient Office Visit  Subjective:  Patient ID: Shaun Henry, male    DOB: 24-Mar-1946  Age: 78 y.o. MRN: [id number]  CC:  Chief Complaint  Patient presents with   Medical Management of Chronic Issues   HPI Shaun Henry presents for chronic follow up   Pt presents with hyperlipidemia.  Current treatment includes Zocor 40mg  qd - was diagnosed about 8 years ago  Compliance with treatment has been good; he takes his medication as directed, maintains his low cholesterol diet, follows up as directed, and maintains his exercise regimen.  He denies experiencing any hypercholesterolemia related symptoms.Concurrent health problems include hypertension.      Pt presents for follow up of hypertension.  Date of diagnosis 07/2009.  His current cardiac medication regimen includes Triamterene/HCTZ 37.5/25 mg QD.  [name]  He is tolerating the medication well without side effects.  Compliance with treatment has been good; he takes his medication as directed, maintains his diet and exercise regimen, and follows up as directed.  Denies chest pain/sob    In regard to the type 2 diabetes mellitus with other specified complication, specifically, this is type 2, non-insulin requiring diabetes, complicated by hypertension and hyperlipidemia. 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   Type 2 diabetes mellitus with other specified complication (HCC)     Past Surgical History:  Procedure Laterality Date   3rd right finger surgery      Family  History  Problem Relation Age of Onset   Other Mother        "natural death" unknown age   Coronary artery disease Father     Social History   Socioeconomic History   Marital status: Married    Spouse name: Carley Hammed   Number of children: 2   Years of education: Not on file   Highest education level: Not on file  Occupational History   Occupation: Truck [employer] doctor   Occupation: retired  Tobacco Use   Smoking status: Some Days    Types: Cigars   Smokeless tobacco: Never   Tobacco comments:    occassional smoker Cigars.  Average 5 per week  Vaping Use   Vaping status: Never Used  Substance and Sexual Activity   Alcohol use: No   Drug use: No   Sexual activity: Not on file  Other Topics Concern   Not on file  Social History Narrative   Not on file   Social Drivers of Health   Financial Resource Strain: Low Risk  (05/18/2023)   Overall Financial Resource Strain (CARDIA)    Difficulty of Paying Living Expenses: Not hard at all  Food Insecurity: No Food Insecurity (05/18/2023)   Hunger Vital Sign    Worried About Running Out of Food in the Last Year: Never true    Ran Out of Food in the Last Year: Never true  Transportation Needs: No Transportation Needs (05/18/2023)   PRAPARE - Administrator, Civil Service (Medical): No  Lack of Transportation (Non-Medical): No  Physical Activity: Insufficiently Active (05/18/2023)   Exercise Vital Sign    Days of Exercise per Week: 3 days    Minutes of Exercise per Session: 30 min  Stress: No Stress Concern Present (05/18/2023)   [employer] of Occupational Health - Occupational Stress Questionnaire    Feeling of Stress : Not at all  Social Connections: Moderately Isolated (05/18/2023)   Social Connection and Isolation Panel [NHANES]    Frequency of Communication with Friends and Family: Twice a week    Frequency of Social Gatherings with Friends and Family: Once a week    Attends Religious Services: Never    Loss adjuster, chartered or Organizations: No    Attends Banker Meetings: Never    Marital Status: Married  Catering manager Violence: Not At Risk (05/18/2023)   Humiliation, Afraid, Rape, and Kick questionnaire    Fear of Current or Ex-Partner: No    Emotionally Abused: No    Physically Abused: No    Sexually Abused: No     Current Outpatient Medications:    aspirin EC 81 MG tablet, Take 81 mg by mouth daily., Disp: , Rfl:    simvastatin (ZOCOR) 40 MG tablet, TAKE 1 TABLET BY MOUTH EVERY DAY, Disp: 30 tablet, Rfl: 5   triamterene-hydrochlorothiazide (MAXZIDE-25) 37.5-25 MG tablet, TAKE 1 TABLET BY MOUTH EVERY DAY, Disp: 30 tablet, Rfl: 5   No Known Allergies  CONSTITUTIONAL: Negative for chills, fatigue, fever, unintentional weight gain and unintentional weight loss.  E/N/T: Negative for ear pain, nasal congestion and sore throat.  CARDIOVASCULAR: Negative for chest pain, dizziness, palpitations and pedal edema.  RESPIRATORY: Negative for recent cough and dyspnea.  GASTROINTESTINAL: Negative for abdominal pain, acid reflux symptoms, constipation, diarrhea, nausea and vomiting.  MSK: Negative for arthralgias and myalgias.  INTEGUMENTARY: Negative for rash.  NEUROLOGICAL: Negative for dizziness and headaches.  PSYCHIATRIC: Negative for sleep disturbance and to question depression screen.  Negative for depression, negative for anhedonia.         Objective:  PHYSICAL EXAM:   VS: BP 116/60   Pulse 84   Temp 98 F (36.7 C)   Ht 5\' 10"  (1.778 m)   Wt 210 lb (95.3 kg)   SpO2 98%   BMI 30.13 kg/m   GEN: Well nourished, well developed, in no acute distress   Cardiac: RRR; no murmurs, rubs, or gallops,no edema -  Respiratory:  normal respiratory rate and pattern with no distress - normal breath sounds with no rales, rhonchi, wheezes or rubs GI: normal bowel sounds, no masses or tenderness   Skin: warm and dry, no rash  Neuro:  Alert and Oriented x 3,  - CN II-Xii grossly  intact Psych: euthymic mood, appropriate affect and demeanor   No visits with results within 1 Day(s) from this visit.  Latest known visit with results is:  Office Visit on 03/30/2023  Component Date Value Ref Range Status   Creatinine, Urine 03/30/2023 99.2  Not Estab. mg/dL Final   Microalbumin, Urine 03/30/2023 12.0  Not Estab. ug/mL Final   Microalb/Creat Ratio 03/30/2023 12  0 - 29 mg/g creat Final   Comment:                        Normal:                0 -  29  Moderately increased: 30 - 300                        Severely increased:       >300    WBC 03/30/2023 5.8  3.4 - 10.8 x10E3/uL Final   RBC 03/30/2023 5.39  4.14 - 5.80 x10E6/uL Final   Hemoglobin 03/30/2023 14.6  13.0 - 17.7 g/dL Final   Hematocrit 54/27/0623 44.3  37.5 - 51.0 % Final   MCV 03/30/2023 82  79 - 97 fL Final   MCH 03/30/2023 27.1  26.6 - 33.0 pg Final   MCHC 03/30/2023 33.0  31.5 - 35.7 g/dL Final   RDW 76/28/3151 14.8  11.6 - 15.4 % Final   Platelets 03/30/2023 258  150 - 450 x10E3/uL Final   Neutrophils 03/30/2023 58  Not Estab. % Final   Lymphs 03/30/2023 30  Not Estab. % Final   Monocytes 03/30/2023 10  Not Estab. % Final   Eos 03/30/2023 1  Not Estab. % Final   Basos 03/30/2023 1  Not Estab. % Final   Neutrophils Absolute 03/30/2023 3.4  1.4 - 7.0 x10E3/uL Final   Lymphocytes Absolute 03/30/2023 1.8  0.7 - 3.1 x10E3/uL Final   Monocytes Absolute 03/30/2023 0.6  0.1 - 0.9 x10E3/uL Final   EOS (ABSOLUTE) 03/30/2023 0.0  0.0 - 0.4 x10E3/uL Final   Basophils Absolute 03/30/2023 0.0  0.0 - 0.2 x10E3/uL Final   Immature Granulocytes 03/30/2023 0  Not Estab. % Final   Immature Grans (Abs) 03/30/2023 0.0  0.0 - 0.1 x10E3/uL Final   Glucose 03/30/2023 77  70 - 99 mg/dL Final   BUN 76/16/0737 12  8 - 27 mg/dL Final   Creatinine, Ser 03/30/2023 1.10  0.76 - 1.27 mg/dL Final   eGFR 10/62/6948 70  >59 mL/min/1.73 Final   BUN/Creatinine Ratio 03/30/2023 11  10 - 24 Final   Sodium  03/30/2023 140  134 - 144 mmol/L Final   Potassium 03/30/2023 4.5  3.5 - 5.2 mmol/L Final   Chloride 03/30/2023 99  96 - 106 mmol/L Final   CO2 03/30/2023 25  20 - 29 mmol/L Final   Calcium 03/30/2023 10.9 (H)  8.6 - 10.2 mg/dL Final   Total Protein 54/62/7035 7.4  6.0 - 8.5 g/dL Final   Albumin 00/93/8182 4.4  3.8 - 4.8 g/dL Final   Globulin, Total 03/30/2023 3.0  1.5 - 4.5 g/dL Final   Bilirubin Total 03/30/2023 0.3  0.0 - 1.2 mg/dL Final   Alkaline Phosphatase 03/30/2023 144 (H)  44 - 121 IU/L Final   AST 03/30/2023 27  0 - 40 IU/L Final   ALT 03/30/2023 31  0 - 44 IU/L Final   Hgb A1c MFr Bld 03/30/2023 6.4 (H)  4.8 - 5.6 % Final   Comment:          Prediabetes: 5.7 - 6.4          Diabetes: >6.4          Glycemic control for adults with diabetes: <7.0    Est. average glucose Bld gHb Est-m* 03/30/2023 137  mg/dL Final   Cholesterol, Total 03/30/2023 165  100 - 199 mg/dL Final   Triglycerides 99/37/1696 209 (H)  0 - 149 mg/dL Final   HDL 78/93/8101 37 (L)  >39 mg/dL Final   VLDL Cholesterol Cal 03/30/2023 36  5 - 40 mg/dL Final   LDL Chol Calc (NIH) 03/30/2023 92  0 - 99 mg/dL Final   Chol/HDL  Ratio 03/30/2023 4.5  0.0 - 5.0 ratio Final   Comment:                                   T. Chol/HDL Ratio                                             Men  Women                               1/2 Avg.Risk  3.4    3.3                                   Avg.Risk  5.0    4.4                                2X Avg.Risk  9.6    7.1                                3X Avg.Risk 23.4   11.0    TSH 03/30/2023 0.764  0.450 - 4.500 uIU/mL Final   Prostate Specific Ag, Serum 03/30/2023 1.6  0.0 - 4.0 ng/mL Final   Comment: Roche ECLIA methodology. According to the American Urological Association, Serum PSA should decrease and remain at undetectable levels after radical prostatectomy. The AUA defines biochemical recurrence as an initial PSA value 0.2 ng/mL or greater followed by a subsequent  confirmatory PSA value 0.2 ng/mL or greater. Values obtained with different assay methods or kits cannot be used interchangeably. Results cannot be interpreted as absolute evidence of the presence or absence of malignant disease.    Color, UA 03/30/2023 yellow  yellow Final   Clarity, UA 03/30/2023 clear  clear Final   Glucose, UA 03/30/2023 negative  negative mg/dL Final   Bilirubin, UA 11/91/4782 negative  negative Final   Ketones, POC UA 03/30/2023 negative  negative mg/dL Final   Spec Grav, UA 95/62/1308 1.020  1.010 - 1.025 Final   Blood, UA 03/30/2023 negative  negative Final   pH, UA 03/30/2023 6.0  5.0 - 8.0 Final   POC PROTEIN,UA 03/30/2023 negative  negative, trace Final   Urobilinogen, UA 03/30/2023 0.2  0.2 or 1.0 E.U./dL Final   Nitrite, UA 65/78/4696 Negative  Negative Final   Leukocytes, UA 03/30/2023 Negative  Negative Final   Diabetic Foot Exam - Simple   No data filed     No visits with results within 1 Day(s) from this visit.  Latest known visit with results is:  Office Visit on 03/30/2023  Component Date Value Ref Range Status   Creatinine, Urine 03/30/2023 99.2  Not Estab. mg/dL Final   Microalbumin, Urine 03/30/2023 12.0  Not Estab. ug/mL Final   Microalb/Creat Ratio 03/30/2023 12  0 - 29 mg/g creat Final   Comment:                        Normal:                0 -  29                        Moderately increased: 30 - 300                        Severely increased:       >300    WBC 03/30/2023 5.8  3.4 - 10.8 x10E3/uL Final   RBC 03/30/2023 5.39  4.14 - 5.80 x10E6/uL Final   Hemoglobin 03/30/2023 14.6  13.0 - 17.7 g/dL Final   Hematocrit 24/40/1027 44.3  37.5 - 51.0 % Final   MCV 03/30/2023 82  79 - 97 fL Final   MCH 03/30/2023 27.1  26.6 - 33.0 pg Final   MCHC 03/30/2023 33.0  31.5 - 35.7 g/dL Final   RDW 25/36/6440 14.8  11.6 - 15.4 % Final   Platelets 03/30/2023 258  150 - 450 x10E3/uL Final   Neutrophils 03/30/2023 58  Not Estab. % Final   Lymphs  03/30/2023 30  Not Estab. % Final   Monocytes 03/30/2023 10  Not Estab. % Final   Eos 03/30/2023 1  Not Estab. % Final   Basos 03/30/2023 1  Not Estab. % Final   Neutrophils Absolute 03/30/2023 3.4  1.4 - 7.0 x10E3/uL Final   Lymphocytes Absolute 03/30/2023 1.8  0.7 - 3.1 x10E3/uL Final   Monocytes Absolute 03/30/2023 0.6  0.1 - 0.9 x10E3/uL Final   EOS (ABSOLUTE) 03/30/2023 0.0  0.0 - 0.4 x10E3/uL Final   Basophils Absolute 03/30/2023 0.0  0.0 - 0.2 x10E3/uL Final   Immature Granulocytes 03/30/2023 0  Not Estab. % Final   Immature Grans (Abs) 03/30/2023 0.0  0.0 - 0.1 x10E3/uL Final   Glucose 03/30/2023 77  70 - 99 mg/dL Final   BUN 34/74/2595 12  8 - 27 mg/dL Final   Creatinine, Ser 03/30/2023 1.10  0.76 - 1.27 mg/dL Final   eGFR 63/87/5643 70  >59 mL/min/1.73 Final   BUN/Creatinine Ratio 03/30/2023 11  10 - 24 Final   Sodium 03/30/2023 140  134 - 144 mmol/L Final   Potassium 03/30/2023 4.5  3.5 - 5.2 mmol/L Final   Chloride 03/30/2023 99  96 - 106 mmol/L Final   CO2 03/30/2023 25  20 - 29 mmol/L Final   Calcium 03/30/2023 10.9 (H)  8.6 - 10.2 mg/dL Final   Total Protein 32/95/1884 7.4  6.0 - 8.5 g/dL Final   Albumin 16/60/6301 4.4  3.8 - 4.8 g/dL Final   Globulin, Total 03/30/2023 3.0  1.5 - 4.5 g/dL Final   Bilirubin Total 03/30/2023 0.3  0.0 - 1.2 mg/dL Final   Alkaline Phosphatase 03/30/2023 144 (H)  44 - 121 IU/L Final   AST 03/30/2023 27  0 - 40 IU/L Final   ALT 03/30/2023 31  0 - 44 IU/L Final   Hgb A1c MFr Bld 03/30/2023 6.4 (H)  4.8 - 5.6 % Final   Comment:          Prediabetes: 5.7 - 6.4          Diabetes: >6.4          Glycemic control for adults with diabetes: <7.0    Est. average glucose Bld gHb Est-m* 03/30/2023 137  mg/dL Final   Cholesterol, Total 03/30/2023 165  100 - 199 mg/dL Final   Triglycerides 60/04/9322 209 (H)  0 - 149 mg/dL Final   HDL 55/73/2202 37 (L)  >39 mg/dL Final   VLDL Cholesterol Cal 03/30/2023  36  5 - 40 mg/dL Final   LDL Chol Calc (NIH)  03/30/2023 92  0 - 99 mg/dL Final   Chol/HDL Ratio 03/30/2023 4.5  0.0 - 5.0 ratio Final   Comment:                                   T. Chol/HDL Ratio                                             Men  Women                               1/2 Avg.Risk  3.4    3.3                                   Avg.Risk  5.0    4.4                                2X Avg.Risk  9.6    7.1                                3X Avg.Risk 23.4   11.0    TSH 03/30/2023 0.764  0.450 - 4.500 uIU/mL Final   Prostate Specific Ag, Serum 03/30/2023 1.6  0.0 - 4.0 ng/mL Final   Comment: Roche ECLIA methodology. According to the American Urological Association, Serum PSA should decrease and remain at undetectable levels after radical prostatectomy. The AUA defines biochemical recurrence as an initial PSA value 0.2 ng/mL or greater followed by a subsequent confirmatory PSA value 0.2 ng/mL or greater. Values obtained with different assay methods or kits cannot be used interchangeably. Results cannot be interpreted as absolute evidence of the presence or absence of malignant disease.    Color, UA 03/30/2023 yellow  yellow Final   Clarity, UA 03/30/2023 clear  clear Final   Glucose, UA 03/30/2023 negative  negative mg/dL Final   Bilirubin, UA 34/74/2595 negative  negative Final   Ketones, POC UA 03/30/2023 negative  negative mg/dL Final   Spec Grav, UA 63/87/5643 1.020  1.010 - 1.025 Final   Blood, UA 03/30/2023 negative  negative Final   pH, UA 03/30/2023 6.0  5.0 - 8.0 Final   POC PROTEIN,UA 03/30/2023 negative  negative, trace Final   Urobilinogen, UA 03/30/2023 0.2  0.2 or 1.0 E.U./dL Final   Nitrite, UA 32/95/1884 Negative  Negative Final   Leukocytes, UA 03/30/2023 Negative  Negative Final     Health Maintenance Due  Topic Date Due   HEMOGLOBIN A1C  09/27/2023    There are no preventive care reminders to display for this patient.  Lab Results  Component Value Date   TSH 0.764 03/30/2023   Lab Results   Component Value Date   WBC 5.8 03/30/2023   HGB 14.6 03/30/2023   HCT 44.3 03/30/2023   MCV 82 03/30/2023   PLT 258 03/30/2023   Lab Results  Component Value Date   NA 140 03/30/2023   K 4.5 03/30/2023   CHLORIDE 104 02/01/2014  CO2 25 03/30/2023   GLUCOSE 77 03/30/2023   BUN 12 03/30/2023   CREATININE 1.10 03/30/2023   BILITOT 0.3 03/30/2023   ALKPHOS 144 (H) 03/30/2023   AST 27 03/30/2023   ALT 31 03/30/2023   PROT 7.4 03/30/2023   ALBUMIN 4.4 03/30/2023   CALCIUM 10.9 (H) 03/30/2023   ANIONGAP 9 02/01/2014   EGFR 70 03/30/2023   Lab Results  Component Value Date   CHOL 165 03/30/2023   Lab Results  Component Value Date   HDL 37 (L) 03/30/2023   Lab Results  Component Value Date   LDLCALC 92 03/30/2023   Lab Results  Component Value Date   TRIG 209 (H) 03/30/2023   Lab Results  Component Value Date   CHOLHDL 4.5 03/30/2023   Lab Results  Component Value Date   HGBA1C 6.4 (H) 03/30/2023      Assessment & Plan:   Problem List Items Addressed This Visit       Cardiovascular and Mediastinum   Hypertension associated with diabetes (HCC)   Relevant Medications   simvastatin (ZOCOR) 40 MG tablet   triamterene-hydrochlorothiazide (MAXZIDE-25) 37.5-25 MG tablet   Other Relevant Orders   CBC with Differential/Platelet   Comprehensive metabolic panel     Endocrine   Diabetes mellitus without complication (HCC)   Relevant Medications      simvastatin (ZOCOR) 40 MG tablet   Other Relevant Orders   CBC with Differential/Platelet   Comprehensive metabolic panel   Hemoglobin A1c Continue to watch diet      Hyperlipidemia associated with type 2 diabetes mellitus (HCC) - Primary   Relevant Medications      simvastatin (ZOCOR) 40 MG tablet   triamterene-hydrochlorothiazide (MAXZIDE-25) 37.5-25 MG tablet   Other Relevant Orders   Lipid panel     Other   Vitamin D insufficiency   Relevant Medications   Vit D level pending      Elevated  calcium Calcium with PTH ordered        No orders of the defined types were placed in this encounter.   Follow-up: Return in about 6 months (around 03/30/2024) for chronic fasting follow-up.    SARA R Mackayla Mullins, PA-C

## 2023-09-29 ENCOUNTER — Other Ambulatory Visit: Payer: Self-pay | Admitting: Physician Assistant

## 2023-09-29 DIAGNOSIS — E559 Vitamin D deficiency, unspecified: Secondary | ICD-10-CM

## 2023-09-29 LAB — CBC WITH DIFFERENTIAL/PLATELET
Basophils Absolute: 0 10*3/uL (ref 0.0–0.2)
Basos: 1 %
EOS (ABSOLUTE): 0 10*3/uL (ref 0.0–0.4)
Eos: 1 %
Hematocrit: 41.7 % (ref 37.5–51.0)
Hemoglobin: 13.4 g/dL (ref 13.0–17.7)
Immature Grans (Abs): 0 10*3/uL (ref 0.0–0.1)
Immature Granulocytes: 0 %
Lymphocytes Absolute: 1.7 10*3/uL (ref 0.7–3.1)
Lymphs: 28 %
MCH: 26.4 pg — ABNORMAL LOW (ref 26.6–33.0)
MCHC: 32.1 g/dL (ref 31.5–35.7)
MCV: 82 fL (ref 79–97)
Monocytes Absolute: 0.4 10*3/uL (ref 0.1–0.9)
Monocytes: 7 %
Neutrophils Absolute: 3.9 10*3/uL (ref 1.4–7.0)
Neutrophils: 63 %
Platelets: 221 10*3/uL (ref 150–450)
RBC: 5.07 x10E6/uL (ref 4.14–5.80)
RDW: 13.4 % (ref 11.6–15.4)
WBC: 6.1 10*3/uL (ref 3.4–10.8)

## 2023-09-29 LAB — COMPREHENSIVE METABOLIC PANEL
ALT: 20 IU/L (ref 0–44)
AST: 21 IU/L (ref 0–40)
Albumin: 4.4 g/dL (ref 3.8–4.8)
Alkaline Phosphatase: 125 IU/L — ABNORMAL HIGH (ref 44–121)
BUN/Creatinine Ratio: 10 (ref 10–24)
BUN: 10 mg/dL (ref 8–27)
Bilirubin Total: 0.4 mg/dL (ref 0.0–1.2)
CO2: 26 mmol/L (ref 20–29)
Calcium: 10.3 mg/dL — ABNORMAL HIGH (ref 8.6–10.2)
Chloride: 99 mmol/L (ref 96–106)
Creatinine, Ser: 0.99 mg/dL (ref 0.76–1.27)
Globulin, Total: 2.5 g/dL (ref 1.5–4.5)
Glucose: 86 mg/dL (ref 70–99)
Potassium: 3.8 mmol/L (ref 3.5–5.2)
Sodium: 139 mmol/L (ref 134–144)
Total Protein: 6.9 g/dL (ref 6.0–8.5)
eGFR: 79 mL/min/{1.73_m2} (ref >59)

## 2023-09-29 LAB — LIPID PANEL
Chol/HDL Ratio: 3.4 ratio (ref 0.0–5.0)
Cholesterol, Total: 121 mg/dL (ref 100–199)
HDL: 36 mg/dL — ABNORMAL LOW (ref >39)
LDL Chol Calc (NIH): 66 mg/dL (ref 0–99)
Triglycerides: 103 mg/dL (ref 0–149)
VLDL Cholesterol Cal: 19 mg/dL (ref 5–40)

## 2023-09-29 LAB — PTH, INTACT AND CALCIUM: PTH: 54 pg/mL (ref 15–65)

## 2023-09-29 LAB — HEMOGLOBIN A1C
Est. average glucose Bld gHb Est-mCnc: 134 mg/dL
Hgb A1c MFr Bld: 6.3 % — ABNORMAL HIGH (ref 4.8–5.6)

## 2023-09-29 LAB — VITAMIN D 25 HYDROXY (VIT D DEFICIENCY, FRACTURES): Vit D, 25-Hydroxy: 13.2 ng/mL — ABNORMAL LOW (ref 30.0–100.0)

## 2023-09-29 MED ORDER — VITAMIN D (ERGOCALCIFEROL) 1.25 MG (50000 UNIT) PO CAPS
50000.0000 [IU] | ORAL_CAPSULE | ORAL | 5 refills | Status: DC
Start: 2023-09-29 — End: 2024-03-30

## 2024-03-30 ENCOUNTER — Ambulatory Visit (INDEPENDENT_AMBULATORY_CARE_PROVIDER_SITE_OTHER): Admitting: Physician Assistant

## 2024-03-30 ENCOUNTER — Encounter: Payer: Self-pay | Admitting: Physician Assistant

## 2024-03-30 VITALS — BP 132/82 | HR 60 | Temp 98.1°F | Ht 70.0 in | Wt 198.0 lb

## 2024-03-30 DIAGNOSIS — E785 Hyperlipidemia, unspecified: Secondary | ICD-10-CM | POA: Diagnosis not present

## 2024-03-30 DIAGNOSIS — E559 Vitamin D deficiency, unspecified: Secondary | ICD-10-CM | POA: Diagnosis not present

## 2024-03-30 DIAGNOSIS — R351 Nocturia: Secondary | ICD-10-CM

## 2024-03-30 DIAGNOSIS — I152 Hypertension secondary to endocrine disorders: Secondary | ICD-10-CM

## 2024-03-30 DIAGNOSIS — E1159 Type 2 diabetes mellitus with other circulatory complications: Secondary | ICD-10-CM

## 2024-03-30 DIAGNOSIS — Z125 Encounter for screening for malignant neoplasm of prostate: Secondary | ICD-10-CM | POA: Diagnosis not present

## 2024-03-30 DIAGNOSIS — E119 Type 2 diabetes mellitus without complications: Secondary | ICD-10-CM

## 2024-03-30 DIAGNOSIS — E1169 Type 2 diabetes mellitus with other specified complication: Secondary | ICD-10-CM | POA: Diagnosis not present

## 2024-03-30 MED ORDER — VITAMIN D (ERGOCALCIFEROL) 1.25 MG (50000 UNIT) PO CAPS: 50000.0000 [IU] | ORAL_CAPSULE | ORAL | 5 refills | Status: AC

## 2024-03-30 NOTE — Progress Notes (Signed)
 Established Patient Office Visit  Subjective:  Patient ID: Shaun Henry, male    DOB: July 31, 1945  Age: 78 y.o. MRN: [id number]  CC:  Chief Complaint  Patient presents with   Medical Management of Chronic Issues   HPI Shaun Henry presents for chronic follow up   Pt presents with hyperlipidemia.  Current treatment includes Zocor  40mg  qd - was diagnosed about 9 years ago  Compliance with treatment has been good; he takes his medication as directed, maintains his low cholesterol diet, follows up as directed, and maintains his exercise regimen.  He denies experiencing any hypercholesterolemia related symptoms.Concurrent health problems include hypertension.      Pt presents for follow up of hypertension.  Date of diagnosis 07/2009.  His current cardiac medication regimen includes Triamterene /HCTZ 37.5/25 mg QD.  SABRA  He is tolerating the medication well without side effects.  Compliance with treatment has been good; he takes his medication as directed, maintains his diet and exercise regimen, and follows up as directed.  Denies chest pain/sob BP today at 132/82    In regard to the type 2 diabetes mellitus with other specified complication, specifically, this is type 2, non-insulin requiring diabetes, complicated by hypertension and hyperlipidemia. 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   Type 2 diabetes mellitus with other specified complication (HCC)     Past Surgical History:  Procedure Laterality Date   3rd  right finger surgery      Family History  Problem Relation Age of Onset   Other Mother        natural death unknown age   Coronary artery disease Father     Social History   Socioeconomic History   Marital status: Married    Spouse name: Elyn   Number of children: 2   Years of education: Not on file   Highest education level: Not on file  Occupational History   Occupation: [employer] doctor   Occupation: retired  Tobacco Use   Smoking status: Some Days    Types: Cigars   Smokeless tobacco: Never   Tobacco comments:    occassional smoker Cigars.  Average 5 per week  Vaping Use   Vaping status: Never Used  Substance and Sexual Activity   Alcohol use: No   Drug use: No   Sexual activity: Not on file  Other Topics Concern   Not on file  Social History Narrative   Not on file   Social Drivers of Health   Financial Resource Strain: Low Risk  (05/18/2023)   Overall Financial Resource Strain (CARDIA)    Difficulty of Paying Living Expenses: Not hard at all  Food Insecurity: No Food Insecurity (05/18/2023)   Hunger Vital Sign    Worried About Running Out of Food in the Last Year: Never true    Ran Out of Food in the Last Year: Never true  Transportation Needs: No Transportation Needs (05/18/2023)   PRAPARE - Transportation  Lack of Transportation (Medical): No    Lack of Transportation (Non-Medical): No  Physical Activity: Insufficiently Active (05/18/2023)   Exercise Vital Sign    Days of Exercise per Week: 3 days    Minutes of Exercise per Session: 30 min  Stress: No Stress Concern Present (05/18/2023)   Shaun [facility] [employer] - Occupational Stress Questionnaire    Feeling of Stress : Not at all  Social Connections: Moderately Isolated (05/18/2023)   Social Connection and Isolation Panel    Frequency of Communication with Friends and Family: Twice a week    Frequency of Social Gatherings with Friends and Family: Once a week    Attends Religious  Services: Never    Database administrator or Organizations: No    Attends Banker Meetings: Never    Marital Status: Married  Catering manager Violence: Not At Risk (05/18/2023)   Humiliation, Afraid, Rape, and Kick questionnaire    Fear of Current or Ex-Partner: No    Emotionally Abused: No    Physically Abused: No    Sexually Abused: No     Current Outpatient Medications:    aspirin EC 81 MG tablet, Take 81 mg by mouth daily., Disp: , Rfl:    simvastatin  (ZOCOR ) 40 MG tablet, Take 1 tablet (40 mg total) by mouth daily., Disp: 90 tablet, Rfl: 1   triamterene -hydrochlorothiazide (MAXZIDE-25) 37.5-25 MG tablet, Take 1 tablet by mouth daily., Disp: 90 tablet, Rfl: 1   Vitamin D , Ergocalciferol , (DRISDOL ) 1.25 MG (50000 UNIT) CAPS capsule, Take 1 capsule (50,000 Units total) by mouth every 7 (seven) days., Disp: 5 capsule, Rfl: 5   Allergies  Allergen Reactions   Glucophage  [Metformin ]     GI   CONSTITUTIONAL: Negative for chills, fatigue, fever, unintentional weight gain and unintentional weight loss.  E/N/T: Negative for ear pain, nasal congestion and sore throat.  CARDIOVASCULAR: Negative for chest pain, dizziness, palpitations and pedal edema.  RESPIRATORY: Negative for recent cough and dyspnea.  GASTROINTESTINAL: Negative for abdominal pain, acid reflux symptoms, constipation, diarrhea, nausea and vomiting. GU - occasionally has nocturia  MSK: Negative for arthralgias and myalgias.  INTEGUMENTARY: Negative for rash.  NEUROLOGICAL: Negative for dizziness and headaches.  PSYCHIATRIC: Negative for sleep disturbance and to question depression screen.  Negative for depression, negative for anhedonia.     Diabetic Foot Exam - Simple   Simple Foot Form Visual Inspection No deformities, no ulcerations, no other skin breakdown bilaterally: Yes Sensation Testing Intact to touch and monofilament testing bilaterally: Yes Pulse Check Posterior Tibialis and Dorsalis pulse  intact bilaterally: Yes Comments        Objective:  PHYSICAL EXAM:   VS: BP 132/82   Pulse 60   Temp 98.1 F (36.7 C)   Ht 5' 10 (1.778 m)   Wt 198 lb (89.8 kg)   SpO2 98%   BMI 28.41 kg/m   GEN: Well nourished, well developed, in no acute distress  Cardiac: RRR; no murmurs, rubs, or gallops,no edema - no significant varicosities Respiratory:  normal respiratory rate and pattern with no distress - normal breath sounds with no rales, rhonchi, wheezes or rubs MS: no deformity or atrophy  Skin: warm and dry, no rash  Neuro:  Alert and Oriented x 3, - CN II-Xii grossly intact Psych: euthymic mood, appropriate affect and demeanor   No visits with results within 1 Day(s) from this visit.  Latest known visit with results is:  Office Visit on 09/28/2023  Component Date Value  Ref Range Status   WBC 09/28/2023 6.1  3.4 - 10.8 x10E3/uL Final   RBC 09/28/2023 5.07  4.14 - 5.80 x10E6/uL Final   Hemoglobin 09/28/2023 13.4  13.0 - 17.7 g/dL Final   Hematocrit 96/80/7974 41.7  37.5 - 51.0 % Final   MCV 09/28/2023 82  79 - 97 fL Final   MCH 09/28/2023 26.4 (L)  26.6 - 33.0 pg Final   MCHC 09/28/2023 32.1  31.5 - 35.7 g/dL Final   RDW 96/80/7974 13.4  11.6 - 15.4 % Final   Platelets 09/28/2023 221  150 - 450 x10E3/uL Final   Neutrophils 09/28/2023 63  Not Estab. % Final   Lymphs 09/28/2023 28  Not Estab. % Final   Monocytes 09/28/2023 7  Not Estab. % Final   Eos 09/28/2023 1  Not Estab. % Final   Basos 09/28/2023 1  Not Estab. % Final   Neutrophils Absolute 09/28/2023 3.9  1.4 - 7.0 x10E3/uL Final   Lymphocytes Absolute 09/28/2023 1.7  0.7 - 3.1 x10E3/uL Final   Monocytes Absolute 09/28/2023 0.4  0.1 - 0.9 x10E3/uL Final   EOS (ABSOLUTE) 09/28/2023 0.0  0.0 - 0.4 x10E3/uL Final   Basophils Absolute 09/28/2023 0.0  0.0 - 0.2 x10E3/uL Final   Immature Granulocytes 09/28/2023 0  Not Estab. % Final   Immature Grans (Abs) 09/28/2023 0.0  0.0 - 0.1 x10E3/uL Final   Glucose 09/28/2023  86  70 - 99 mg/dL Final   BUN 96/80/7974 10  8 - 27 mg/dL Final   Creatinine, Ser 09/28/2023 0.99  0.76 - 1.27 mg/dL Final   eGFR 96/80/7974 79  >59 mL/min/1.73 Final   BUN/Creatinine Ratio 09/28/2023 10  10 - 24 Final   Sodium 09/28/2023 139  134 - 144 mmol/L Final   Potassium 09/28/2023 3.8  3.5 - 5.2 mmol/L Final   Chloride 09/28/2023 99  96 - 106 mmol/L Final   CO2 09/28/2023 26  20 - 29 mmol/L Final   Calcium 09/28/2023 10.3 (H)  8.6 - 10.2 mg/dL Final   Total Protein 96/80/7974 6.9  6.0 - 8.5 g/dL Final   Albumin 96/80/7974 4.4  3.8 - 4.8 g/dL Final   Globulin, Total 09/28/2023 2.5  1.5 - 4.5 g/dL Final   Bilirubin Total 09/28/2023 0.4  0.0 - 1.2 mg/dL Final   Alkaline Phosphatase 09/28/2023 125 (H)  44 - 121 IU/L Final   AST 09/28/2023 21  0 - 40 IU/L Final   ALT 09/28/2023 20  0 - 44 IU/L Final   Cholesterol, Total 09/28/2023 121  100 - 199 mg/dL Final   Triglycerides 96/80/7974 103  0 - 149 mg/dL Final   HDL 96/80/7974 36 (L)  >39 mg/dL Final   VLDL Cholesterol Cal 09/28/2023 19  5 - 40 mg/dL Final   LDL Chol Calc (NIH) 09/28/2023 66  0 - 99 mg/dL Final   Chol/HDL Ratio 09/28/2023 3.4  0.0 - 5.0 ratio Final   Comment:                                   T. Chol/HDL Ratio                                             Men  Women  1/2 Avg.Risk  3.4    3.3                                   Avg.Risk  5.0    4.4                                2X Avg.Risk  9.6    7.1                                3X Avg.Risk 23.4   11.0    Hgb A1c MFr Bld 09/28/2023 6.3 (H)  4.8 - 5.6 % Final   Comment:          Prediabetes: 5.7 - 6.4          Diabetes: >6.4          Glycemic control for adults with diabetes: <7.0    Est. average glucose Bld gHb Est-m* 09/28/2023 134  mg/dL Final   Vit D, 74-Ybimnkb 09/28/2023 13.2 (L)  30.0 - 100.0 ng/mL Final   Comment: Vitamin D  deficiency has been defined by the Institute of Medicine and an Endocrine Society practice guideline as  a level of serum 25-OH vitamin D  less than 20 ng/mL (1,2). The Endocrine Society went on to further define vitamin D  insufficiency as a level between 21 and 29 ng/mL (2). 1. IOM (Institute of Medicine). 2010. Dietary reference    intakes for calcium and D. [place]: The    [employer]. 2. Holick MF, Binkley Jeffers, Bischoff-Ferrari HA, et al.    Evaluation, treatment, and prevention of vitamin D     deficiency: an Endocrine Society clinical practice    guideline. JCEM. 2011 Jul; 96(7):1911-30.    PTH 09/28/2023 54  15 - 65 pg/mL Final   PTH Interp 09/28/2023 Comment   Final   Comment: Interpretation                 Intact PTH    Calcium                                 (pg/mL)      (mg/dL) Normal                          15 - 65     8.6 - 10.2 Primary Hyperparathyroidism         >65          >10.2 Secondary Hyperparathyroidism       >65          <10.2 Non-Parathyroid Hypercalcemia       <65          >10.2 Hypoparathyroidism                  <15          < 8.6 Non-Parathyroid Hypocalcemia    15 - 65          < 8.6       No visits with results within 1 Day(s) from this visit.  Latest known visit with results is:  Office Visit on 09/28/2023  Component Date Value Ref Range Status   WBC 09/28/2023 6.1  3.4 -  10.8 x10E3/uL Final   RBC 09/28/2023 5.07  4.14 - 5.80 x10E6/uL Final   Hemoglobin 09/28/2023 13.4  13.0 - 17.7 g/dL Final   Hematocrit 96/80/7974 41.7  37.5 - 51.0 % Final   MCV 09/28/2023 82  79 - 97 fL Final   MCH 09/28/2023 26.4 (L)  26.6 - 33.0 pg Final   MCHC 09/28/2023 32.1  31.5 - 35.7 g/dL Final   RDW 96/80/7974 13.4  11.6 - 15.4 % Final   Platelets 09/28/2023 221  150 - 450 x10E3/uL Final   Neutrophils 09/28/2023 63  Not Estab. % Final   Lymphs 09/28/2023 28  Not Estab. % Final   Monocytes 09/28/2023 7  Not Estab. % Final   Eos 09/28/2023 1  Not Estab. % Final   Basos 09/28/2023 1  Not Estab. % Final   Neutrophils Absolute 09/28/2023 3.9  1.4 - 7.0  x10E3/uL Final   Lymphocytes Absolute 09/28/2023 1.7  0.7 - 3.1 x10E3/uL Final   Monocytes Absolute 09/28/2023 0.4  0.1 - 0.9 x10E3/uL Final   EOS (ABSOLUTE) 09/28/2023 0.0  0.0 - 0.4 x10E3/uL Final   Basophils Absolute 09/28/2023 0.0  0.0 - 0.2 x10E3/uL Final   Immature Granulocytes 09/28/2023 0  Not Estab. % Final   Immature Grans (Abs) 09/28/2023 0.0  0.0 - 0.1 x10E3/uL Final   Glucose 09/28/2023 86  70 - 99 mg/dL Final   BUN 96/80/7974 10  8 - 27 mg/dL Final   Creatinine, Ser 09/28/2023 0.99  0.76 - 1.27 mg/dL Final   eGFR 96/80/7974 79  >59 mL/min/1.73 Final   BUN/Creatinine Ratio 09/28/2023 10  10 - 24 Final   Sodium 09/28/2023 139  134 - 144 mmol/L Final   Potassium 09/28/2023 3.8  3.5 - 5.2 mmol/L Final   Chloride 09/28/2023 99  96 - 106 mmol/L Final   CO2 09/28/2023 26  20 - 29 mmol/L Final   Calcium 09/28/2023 10.3 (H)  8.6 - 10.2 mg/dL Final   Total Protein 96/80/7974 6.9  6.0 - 8.5 g/dL Final   Albumin 96/80/7974 4.4  3.8 - 4.8 g/dL Final   Globulin, Total 09/28/2023 2.5  1.5 - 4.5 g/dL Final   Bilirubin Total 09/28/2023 0.4  0.0 - 1.2 mg/dL Final   Alkaline Phosphatase 09/28/2023 125 (H)  44 - 121 IU/L Final   AST 09/28/2023 21  0 - 40 IU/L Final   ALT 09/28/2023 20  0 - 44 IU/L Final   Cholesterol, Total 09/28/2023 121  100 - 199 mg/dL Final   Triglycerides 96/80/7974 103  0 - 149 mg/dL Final   HDL 96/80/7974 36 (L)  >39 mg/dL Final   VLDL Cholesterol Cal 09/28/2023 19  5 - 40 mg/dL Final   LDL Chol Calc (NIH) 09/28/2023 66  0 - 99 mg/dL Final   Chol/HDL Ratio 09/28/2023 3.4  0.0 - 5.0 ratio Final   Comment:                                   T. Chol/HDL Ratio                                             Men  Women  1/2 Avg.Risk  3.4    3.3                                   Avg.Risk  5.0    4.4                                2X Avg.Risk  9.6    7.1                                3X Avg.Risk 23.4   11.0    Hgb A1c MFr Bld 09/28/2023 6.3 (H)   4.8 - 5.6 % Final   Comment:          Prediabetes: 5.7 - 6.4          Diabetes: >6.4          Glycemic control for adults with diabetes: <7.0    Est. average glucose Bld gHb Est-m* 09/28/2023 134  mg/dL Final   Vit D, 74-Ybimnkb 09/28/2023 13.2 (L)  30.0 - 100.0 ng/mL Final   Comment: Vitamin D  deficiency has been defined by the Institute of Medicine and an Endocrine Society practice guideline as a level of serum 25-OH vitamin D  less than 20 ng/mL (1,2). The Endocrine Society went on to further define vitamin D  insufficiency as a level between 21 and 29 ng/mL (2). 1. IOM (Institute of Medicine). 2010. Dietary reference    intakes for calcium and D. [place]: The    [employer]. 2. Holick MF, Binkley [place], Bischoff-Ferrari HA, et al.    Evaluation, treatment, and prevention of vitamin D     deficiency: an Endocrine Society clinical practice    guideline. JCEM. 2011 Jul; 96(7):1911-30.    PTH 09/28/2023 54  15 - 65 pg/mL Final   PTH Interp 09/28/2023 Comment   Final   Comment: Interpretation                 Intact PTH    Calcium                                 (pg/mL)      (mg/dL) Normal                          15 - 65     8.6 - 10.2 Primary Hyperparathyroidism         >65          >10.2 Secondary Hyperparathyroidism       >65          <10.2 Non-Parathyroid Hypercalcemia       <65          >10.2 Hypoparathyroidism                  <15          < 8.6 Non-Parathyroid Hypocalcemia    15 - 65          < 8.6      Health Maintenance Due  Topic Date Due   Diabetic kidney evaluation - Urine ACR  03/29/2024   FOOT EXAM  03/29/2024   HEMOGLOBIN A1C  03/30/2024   Medicare Annual Wellness (AWV)  05/17/2024  There are no preventive care reminders to display for this patient.  Lab Results  Component Value Date   TSH 0.764 03/30/2023   Lab Results  Component Value Date   WBC 6.1 09/28/2023   HGB 13.4 09/28/2023   HCT 41.7 09/28/2023   MCV 82 09/28/2023   PLT  221 09/28/2023   Lab Results  Component Value Date   NA 139 09/28/2023   K 3.8 09/28/2023   CHLORIDE 104 02/01/2014   CO2 26 09/28/2023   GLUCOSE 86 09/28/2023   BUN 10 09/28/2023   CREATININE 0.99 09/28/2023   BILITOT 0.4 09/28/2023   ALKPHOS 125 (H) 09/28/2023   AST 21 09/28/2023   ALT 20 09/28/2023   PROT 6.9 09/28/2023   ALBUMIN 4.4 09/28/2023   CALCIUM 10.3 (H) 09/28/2023   ANIONGAP 9 02/01/2014   EGFR 79 09/28/2023   Lab Results  Component Value Date   CHOL 121 09/28/2023   Lab Results  Component Value Date   HDL 36 (L) 09/28/2023   Lab Results  Component Value Date   LDLCALC 66 09/28/2023   Lab Results  Component Value Date   TRIG 103 09/28/2023   Lab Results  Component Value Date   CHOLHDL 3.4 09/28/2023   Lab Results  Component Value Date   HGBA1C 6.3 (H) 09/28/2023      Assessment & Plan:   Problem List Items Addressed This Visit       Cardiovascular and Mediastinum   Hypertension associated with diabetes (HCC)   Relevant Medications   simvastatin  (ZOCOR ) 40 MG tablet   triamterene -hydrochlorothiazide (MAXZIDE-25) 37.5-25 MG tablet   Other Relevant Orders   CBC with Differential/Platelet   Comprehensive metabolic panel     Endocrine   Diabetes mellitus without complication (HCC)   Relevant Medications      simvastatin  (ZOCOR ) 40 MG tablet   Other Relevant Orders   CBC with Differential/Platelet   Comprehensive metabolic panel   Hemoglobin A1c Continue to watch diet      Hyperlipidemia associated with type 2 diabetes mellitus (HCC) - Primary   Relevant Medications      simvastatin  (ZOCOR ) 40 MG tablet   triamterene -hydrochlorothiazide (MAXZIDE-25) 37.5-25 MG tablet   Other Relevant Orders   Lipid panel     Other   Vitamin D  insufficiency   Relevant Medications   Vit D level pending      Nocturia  PSA pending        No orders of the defined types were placed in this encounter.   Follow-up: Return in about 6 months  (around 09/27/2024) for chronic fasting follow-up.    SARA R Chassidy Layson, PA-C

## 2024-03-31 LAB — COMPREHENSIVE METABOLIC PANEL WITH GFR
ALT: 14 IU/L (ref 0–44)
AST: 15 IU/L (ref 0–40)
Albumin: 4.4 g/dL (ref 3.8–4.8)
Alkaline Phosphatase: 107 IU/L (ref 47–123)
BUN/Creatinine Ratio: 8 — ABNORMAL LOW (ref 10–24)
BUN: 9 mg/dL (ref 8–27)
Bilirubin Total: 0.6 mg/dL (ref 0.0–1.2)
CO2: 25 mmol/L (ref 20–29)
Calcium: 10.3 mg/dL — ABNORMAL HIGH (ref 8.6–10.2)
Chloride: 96 mmol/L (ref 96–106)
Creatinine, Ser: 1.08 mg/dL (ref 0.76–1.27)
Globulin, Total: 2.3 g/dL (ref 1.5–4.5)
Glucose: 113 mg/dL — ABNORMAL HIGH (ref 70–99)
Potassium: 3.7 mmol/L (ref 3.5–5.2)
Sodium: 138 mmol/L (ref 134–144)
Total Protein: 6.7 g/dL (ref 6.0–8.5)
eGFR: 71 mL/min/1.73 (ref >59)

## 2024-03-31 LAB — PSA: Prostate Specific Ag, Serum: 1.3 ng/mL (ref 0.0–4.0)

## 2024-03-31 LAB — CBC WITH DIFFERENTIAL/PLATELET
Basophils Absolute: 0 x10E3/uL (ref 0.0–0.2)
Basos: 1 %
EOS (ABSOLUTE): 0 x10E3/uL (ref 0.0–0.4)
Eos: 1 %
Hematocrit: 38.7 % (ref 37.5–51.0)
Hemoglobin: 13 g/dL (ref 13.0–17.7)
Immature Grans (Abs): 0 x10E3/uL (ref 0.0–0.1)
Immature Granulocytes: 0 %
Lymphocytes Absolute: 1.6 x10E3/uL (ref 0.7–3.1)
Lymphs: 32 %
MCH: 28 pg (ref 26.6–33.0)
MCHC: 33.6 g/dL (ref 31.5–35.7)
MCV: 83 fL (ref 79–97)
Monocytes Absolute: 0.4 x10E3/uL (ref 0.1–0.9)
Monocytes: 8 %
Neutrophils Absolute: 2.9 x10E3/uL (ref 1.4–7.0)
Neutrophils: 58 %
Platelets: 205 x10E3/uL (ref 150–450)
RBC: 4.64 x10E6/uL (ref 4.14–5.80)
RDW: 13.8 % (ref 11.6–15.4)
WBC: 5 x10E3/uL (ref 3.4–10.8)

## 2024-03-31 LAB — HEMOGLOBIN A1C
Est. average glucose Bld gHb Est-mCnc: 134 mg/dL
Hgb A1c MFr Bld: 6.3 % — ABNORMAL HIGH (ref 4.8–5.6)

## 2024-03-31 LAB — LIPID PANEL
Chol/HDL Ratio: 3.1 ratio (ref 0.0–5.0)
Cholesterol, Total: 108 mg/dL (ref 100–199)
HDL: 35 mg/dL — ABNORMAL LOW (ref >39)
LDL Chol Calc (NIH): 56 mg/dL (ref 0–99)
Triglycerides: 86 mg/dL (ref 0–149)
VLDL Cholesterol Cal: 17 mg/dL (ref 5–40)

## 2024-03-31 LAB — TSH: TSH: 0.813 u[IU]/mL (ref 0.450–4.500)

## 2024-03-31 LAB — MICROALBUMIN / CREATININE URINE RATIO
Creatinine, Urine: 85.6 mg/dL
Microalb/Creat Ratio: 6 mg/g{creat} (ref 0–29)
Microalbumin, Urine: 5.4 ug/mL

## 2024-03-31 LAB — VITAMIN D 25 HYDROXY (VIT D DEFICIENCY, FRACTURES): Vit D, 25-Hydroxy: 32.6 ng/mL (ref 30.0–100.0)

## 2024-04-02 ENCOUNTER — Ambulatory Visit: Payer: Self-pay | Admitting: Physician Assistant

## 2024-05-03 ENCOUNTER — Other Ambulatory Visit: Payer: Self-pay | Admitting: Physician Assistant

## 2024-05-03 DIAGNOSIS — E1169 Type 2 diabetes mellitus with other specified complication: Secondary | ICD-10-CM

## 2024-05-24 ENCOUNTER — Ambulatory Visit

## 2024-06-06 ENCOUNTER — Telehealth: Payer: Self-pay | Admitting: Physician Assistant

## 2024-06-06 ENCOUNTER — Other Ambulatory Visit: Payer: Self-pay | Admitting: Physician Assistant

## 2024-06-06 ENCOUNTER — Telehealth: Payer: Self-pay

## 2024-06-06 DIAGNOSIS — I10 Essential (primary) hypertension: Secondary | ICD-10-CM

## 2024-06-06 NOTE — Telephone Encounter (Signed)
 Prescription Request  06/06/2024  LOV: 03/30/2024  What is the name of the medication or equipment? triamterene -hydrochlorothiazide (MAXZIDE-25) 37.5-25 MG tablet   Have you contacted your pharmacy to request a refill? No   Which pharmacy would you like this sent to?  [id number] - [place], [place] - [address] [place] [address] Phone: [phone] Fax: [phone]    Patient notified that their request is being sent to the clinical staff for review and that they should receive a response within 2 business days.   Please advise at [facility] [phone]

## 2024-09-04 ENCOUNTER — Ambulatory Visit
